# Patient Record
Sex: Female | Born: 1935 | Race: White | Hispanic: No | Smoking: Never smoker
Health system: Southern US, Community
[De-identification: ages and names within clinical notes are randomized; demographics above are authoritative.]

## PROBLEM LIST (undated history)

## (undated) DIAGNOSIS — F419 Anxiety disorder, unspecified: Secondary | ICD-10-CM

## (undated) DIAGNOSIS — Z8781 Personal history of (healed) traumatic fracture: Secondary | ICD-10-CM

## (undated) DIAGNOSIS — N182 Chronic kidney disease, stage 2 (mild): Secondary | ICD-10-CM

## (undated) DIAGNOSIS — Z8619 Personal history of other infectious and parasitic diseases: Secondary | ICD-10-CM

## (undated) HISTORY — DX: Anxiety disorder, unspecified: F41.9

## (undated) HISTORY — DX: Personal history of (healed) traumatic fracture: Z87.81

## (undated) HISTORY — PX: EYE SURGERY: SHX253

## (undated) HISTORY — DX: Personal history of other infectious and parasitic diseases: Z86.19

---

## 2008-03-04 ENCOUNTER — Encounter (HOSPITAL_BASED_OUTPATIENT_CLINIC_OR_DEPARTMENT_OTHER): Admission: RE | Admit: 2008-03-04 | Discharge: 2008-06-02 | Payer: Self-pay | Admitting: Internal Medicine

## 2008-06-03 ENCOUNTER — Encounter (HOSPITAL_BASED_OUTPATIENT_CLINIC_OR_DEPARTMENT_OTHER): Admission: RE | Admit: 2008-06-03 | Discharge: 2008-09-01 | Payer: Self-pay | Admitting: General Surgery

## 2008-09-03 ENCOUNTER — Encounter (HOSPITAL_BASED_OUTPATIENT_CLINIC_OR_DEPARTMENT_OTHER): Admission: RE | Admit: 2008-09-03 | Discharge: 2008-11-05 | Payer: Self-pay | Admitting: Internal Medicine

## 2009-02-28 HISTORY — PX: FRACTURE SURGERY: SHX138

## 2009-08-07 ENCOUNTER — Emergency Department (HOSPITAL_BASED_OUTPATIENT_CLINIC_OR_DEPARTMENT_OTHER): Admission: EM | Admit: 2009-08-07 | Discharge: 2009-08-07 | Payer: Self-pay | Admitting: Emergency Medicine

## 2009-08-07 ENCOUNTER — Ambulatory Visit: Payer: Self-pay | Admitting: Diagnostic Radiology

## 2009-08-08 ENCOUNTER — Emergency Department (HOSPITAL_BASED_OUTPATIENT_CLINIC_OR_DEPARTMENT_OTHER): Admission: EM | Admit: 2009-08-08 | Discharge: 2009-08-08 | Payer: Self-pay | Admitting: Emergency Medicine

## 2009-08-08 ENCOUNTER — Ambulatory Visit: Payer: Self-pay | Admitting: Diagnostic Radiology

## 2009-11-10 ENCOUNTER — Inpatient Hospital Stay (HOSPITAL_COMMUNITY): Admission: EM | Admit: 2009-11-10 | Discharge: 2009-11-18 | Payer: Self-pay | Admitting: Emergency Medicine

## 2009-11-10 ENCOUNTER — Ambulatory Visit: Payer: Self-pay | Admitting: Internal Medicine

## 2010-02-28 HISTORY — PX: FRACTURE SURGERY: SHX138

## 2010-03-04 ENCOUNTER — Encounter (HOSPITAL_BASED_OUTPATIENT_CLINIC_OR_DEPARTMENT_OTHER)
Admission: RE | Admit: 2010-03-04 | Discharge: 2010-03-30 | Payer: Self-pay | Source: Home / Self Care | Attending: Internal Medicine | Admitting: Internal Medicine

## 2010-03-11 ENCOUNTER — Ambulatory Visit
Admission: RE | Admit: 2010-03-11 | Discharge: 2010-03-11 | Payer: Self-pay | Source: Home / Self Care | Attending: Surgery | Admitting: Surgery

## 2010-03-31 ENCOUNTER — Encounter (HOSPITAL_BASED_OUTPATIENT_CLINIC_OR_DEPARTMENT_OTHER): Payer: Medicare Other | Attending: Internal Medicine

## 2010-03-31 DIAGNOSIS — L97309 Non-pressure chronic ulcer of unspecified ankle with unspecified severity: Secondary | ICD-10-CM | POA: Insufficient documentation

## 2010-03-31 DIAGNOSIS — Z96619 Presence of unspecified artificial shoulder joint: Secondary | ICD-10-CM | POA: Insufficient documentation

## 2010-04-01 ENCOUNTER — Encounter (HOSPITAL_BASED_OUTPATIENT_CLINIC_OR_DEPARTMENT_OTHER): Payer: Medicare Other

## 2010-05-04 ENCOUNTER — Encounter (HOSPITAL_BASED_OUTPATIENT_CLINIC_OR_DEPARTMENT_OTHER): Payer: Medicare Other | Attending: Internal Medicine

## 2010-05-04 DIAGNOSIS — L97309 Non-pressure chronic ulcer of unspecified ankle with unspecified severity: Secondary | ICD-10-CM | POA: Insufficient documentation

## 2010-05-04 DIAGNOSIS — Z96619 Presence of unspecified artificial shoulder joint: Secondary | ICD-10-CM | POA: Insufficient documentation

## 2010-05-13 LAB — CBC
HCT: 40.2 % (ref 36.0–46.0)
Hemoglobin: 15.9 g/dL — ABNORMAL HIGH (ref 12.0–15.0)
MCH: 29.9 pg (ref 26.0–34.0)
MCH: 30.9 pg (ref 26.0–34.0)
MCHC: 32.6 g/dL (ref 30.0–36.0)
MCHC: 32.7 g/dL (ref 30.0–36.0)
MCV: 91.8 fL (ref 78.0–100.0)
Platelets: 276 10*3/uL (ref 150–400)
Platelets: 378 10*3/uL (ref 150–400)
RDW: 13.3 % (ref 11.5–15.5)
RDW: 13.3 % (ref 11.5–15.5)
WBC: 12.6 10*3/uL — ABNORMAL HIGH (ref 4.0–10.5)

## 2010-05-13 LAB — DIFFERENTIAL
Basophils Absolute: 0 10*3/uL (ref 0.0–0.1)
Basophils Relative: 0 % (ref 0–1)
Eosinophils Absolute: 0 10*3/uL (ref 0.0–0.7)
Eosinophils Absolute: 0 10*3/uL (ref 0.0–0.7)
Eosinophils Relative: 0 % (ref 0–5)
Lymphocytes Relative: 10 % — ABNORMAL LOW (ref 12–46)
Lymphs Abs: 0.9 10*3/uL (ref 0.7–4.0)
Lymphs Abs: 1.1 10*3/uL (ref 0.7–4.0)
Monocytes Absolute: 0.4 10*3/uL (ref 0.1–1.0)
Monocytes Relative: 4 % (ref 3–12)
Monocytes Relative: 9 % (ref 3–12)
Neutro Abs: 11 10*3/uL — ABNORMAL HIGH (ref 1.7–7.7)
Neutrophils Relative %: 81 % — ABNORMAL HIGH (ref 43–77)
Neutrophils Relative %: 87 % — ABNORMAL HIGH (ref 43–77)

## 2010-05-13 LAB — PROTIME-INR
INR: 1.14 (ref 0.00–1.49)
INR: 1.38 (ref 0.00–1.49)
INR: 1.89 — ABNORMAL HIGH (ref 0.00–1.49)
INR: 0.99 (ref 0.00–1.49)
Prothrombin Time: 15.2 seconds (ref 11.6–15.2)
Prothrombin Time: 17.2 seconds — ABNORMAL HIGH (ref 11.6–15.2)
Prothrombin Time: 21.9 seconds — ABNORMAL HIGH (ref 11.6–15.2)

## 2010-05-13 LAB — POCT I-STAT, CHEM 8
Creatinine, Ser: 0.8 mg/dL (ref 0.4–1.2)
HCT: 51 % — ABNORMAL HIGH (ref 36.0–46.0)
Potassium: 3.4 mEq/L — ABNORMAL LOW (ref 3.5–5.1)
Sodium: 141 mEq/L (ref 135–145)

## 2010-05-13 LAB — COMPREHENSIVE METABOLIC PANEL
ALT: 13 U/L (ref 0–35)
AST: 20 U/L (ref 0–37)
Calcium: 8.1 mg/dL — ABNORMAL LOW (ref 8.4–10.5)
Creatinine, Ser: 0.85 mg/dL (ref 0.4–1.2)
GFR calc Af Amer: >60 mL/min (ref >60)
Sodium: 139 mEq/L (ref 135–145)
Total Protein: 5.4 g/dL — ABNORMAL LOW (ref 6.0–8.3)

## 2010-05-13 LAB — BASIC METABOLIC PANEL
CO2: 28 mEq/L (ref 19–32)
Chloride: 103 mEq/L (ref 96–112)
GFR calc Af Amer: >60 mL/min (ref >60)
Glucose, Bld: 118 mg/dL — ABNORMAL HIGH (ref 70–99)

## 2010-05-13 LAB — TYPE AND SCREEN
ABO/RH(D): B POS
Antibody Screen: NEGATIVE

## 2010-05-13 LAB — ABO/RH: ABO/RH(D): B POS

## 2010-05-13 LAB — LIPID PANEL
HDL: 62 mg/dL (ref >39)
LDL Cholesterol: 72 mg/dL (ref 0–99)
Total CHOL/HDL Ratio: 2.4 RATIO
VLDL: 14 mg/dL (ref 0–40)

## 2010-05-13 LAB — CARDIAC PANEL(CRET KIN+CKTOT+MB+TROPI)
CK, MB: 2.3 ng/mL (ref 0.3–4.0)
CK, MB: 3.4 ng/mL (ref 0.3–4.0)
Relative Index: INVALID (ref 0.0–2.5)
Total CK: 92 U/L (ref 7–177)
Troponin I: 0.02 ng/mL (ref 0.00–0.06)

## 2010-05-13 LAB — URINALYSIS, ROUTINE W REFLEX MICROSCOPIC
Bilirubin Urine: NEGATIVE
Urobilinogen, UA: 0.2 mg/dL (ref 0.0–1.0)
pH: 6.5 (ref 5.0–8.0)

## 2010-05-13 LAB — POCT CARDIAC MARKERS
CKMB, poc: <1 ng/mL — ABNORMAL LOW (ref 1.0–8.0)
Myoglobin, poc: 114 ng/mL (ref 12–200)

## 2010-05-13 LAB — HEMOGLOBIN A1C: Mean Plasma Glucose: 111 mg/dL (ref <117)

## 2010-05-13 LAB — TROPONIN I: Troponin I: 0.02 ng/mL (ref 0.00–0.06)

## 2010-05-13 LAB — MAGNESIUM: Magnesium: 1.7 mg/dL (ref 1.5–2.5)

## 2010-05-13 LAB — APTT: aPTT: 31 seconds (ref 24–37)

## 2010-05-13 LAB — MRSA PCR SCREENING: MRSA by PCR: NEGATIVE

## 2010-06-09 ENCOUNTER — Encounter (HOSPITAL_BASED_OUTPATIENT_CLINIC_OR_DEPARTMENT_OTHER): Payer: Medicare Other | Attending: Internal Medicine

## 2010-06-09 DIAGNOSIS — L97309 Non-pressure chronic ulcer of unspecified ankle with unspecified severity: Secondary | ICD-10-CM | POA: Insufficient documentation

## 2010-06-09 DIAGNOSIS — Z96619 Presence of unspecified artificial shoulder joint: Secondary | ICD-10-CM | POA: Insufficient documentation

## 2010-07-13 NOTE — Assessment & Plan Note (Signed)
[facility]   NAME:  Veronica [name], Veronica [name]              ACCOUNT NO.:  [id number]   MEDICAL RECORD NO.:  [id number]      DATE OF BIRTH:  03/15/1912   PHYSICIAN:  Jonelle Sports. Sevier, M.D.  VISIT DATE:  09/10/2008                                   OFFICE VISIT   HISTORY:  This 77 year old white female is followed for a stasis  ulceration on the distal pretibial area on the right.  Since she was  last seen 2 weeks ago, there has been some reduction in size of the  primary wound, but they are now 2 peripheral areas that appear to be  slightly macerated and with some suggestion there may be a low-grade  follicular infection there.   On examination, her blood pressure is 164/79, pulse 60, respirations 18,  temperature 98.1.  The ulcer size is 1.3 x 1.9 x 0.1 cm.  There is no  active drainage, but again there is some hint of some maceration that  may have occurred in satellite areas, not contiguous with the wound, but  one laterally and another medially.   IMPRESSION:  Satisfactory progress impeded by the slow healing of  advanced age and peripheral arterial disease.   DISPOSITION:  The areas of follicular concern are appropriately cultured  today.   The wound itself will be treated with an application of Prisma and then  covered with an Adaptic pad as will the 2 areas of other concern.  That  extremity will then be returned to an Unna wrap.  This wrap is changed  twice weekly by the Home Health nurses.   Because of the concerns and because the culture being made today, the  patient will be seen again in 1 week.           ______________________________  Jonelle Sports. Cheryll Cockayne, M.D.     RES/MEDQ  D:  09/10/2008  T:  09/10/2008  Job:  [id number]

## 2010-07-13 NOTE — Assessment & Plan Note (Signed)
[facility]   NAME:  JERIAH, SKUFCA              ACCOUNT NO.:  [id number]   MEDICAL RECORD NO.:  [id number]      DATE OF BIRTH:  1912-11-25   PHYSICIAN:  Joanne Gavel, M.D.         VISIT DATE:  09/17/2008                                   OFFICE VISIT   HISTORY OF PRESENT ILLNESS:  A 77 year old female with stasis  ulcerations, distal pretibial area.  The patient has developed some  wounds on the lateral aspect of the leg.   PHYSICAL EXAMINATION:  Temperature 98, pulse 60, respirations 18, blood  pressure 150/72.  The right lower extremity has several tiny new  ulcerations laterally.  The ulcerations medially are somewhat smaller.   PLAN:  The wounds are washed.  There is no need for debridement.  Dress  the wound with gentamicin ointment and Unna boots, and see in 7 days.      Joanne Gavel, M.D.  Electronically Signed     RA/MEDQ  D:  09/17/2008  T:  09/17/2008  Job:  [id number]

## 2010-07-13 NOTE — Assessment & Plan Note (Signed)
[facility]   NAME:  Veronica [name], Veronica [name]              ACCOUNT NO.:  [id number]   MEDICAL RECORD NO.:  [id number]      DATE OF BIRTH:  June 11, 1912   PHYSICIAN:  Barry Dienes. Eloise Harman, M.D. VISIT DATE:  05/20/2008                                   OFFICE VISIT   SUBJECTIVE:  The patient is a 77 year old woman who is followed at our  clinic for right leg venous stasis ulcers.  She occasionally has some  discomfort in the ulcers and last evening had a difficult time sleeping  because of right leg pain.  She has been somewhat reluctant to take  Percocet, fearing that she would be addicted to the medication.  She has  not had fever or chills.   OBJECTIVE:  VITAL SIGNS:  Blood pressure 143/73, pulse 60, respirations  18, and temperature 98.1.   The condition of the wounds is as follows:  Wound #1:  It is located in the lateral aspect of the right leg and  measures 6.0 cm x 2.5 cm x 0.2 cm.  This wound does not have significant  eschar, but has minimal necrotic debris with a yellow and pale pink  wound base and pink granulation tissue.  There is no exposed bone,  tendon, or muscle and no foul odor and a small amount of serosanguineous  discharge with mild erythema around the wound.  Wound #2:  It is located on the posterior right leg and measures 1.5 cm  x 0.8 cm x 0.1 cm.  This wound does not have significant eschar and has  minimal necrotic debris with pale pink wound base and pink granulation  tissue.  There was no exposed bone, tendon, muscle, and no foul odor.  There is a small amount of serosanguineous discharge with an intact  periwound integrity.   ASSESSMENT:  There was slight improvement in the appearance of stasis  ulcers on the right leg after treatment with Oasis last week.   PLAN:  After 5% lidocaine gel was applied to the ulcers, a #15 scalpel  was used to debride the necrotic material from the ulcers.  This was  accomplished without significant pain or  bleeding.  She was also  encouraged to treat her pain as necessary.  She was given a prescription  for tramadol 50 mg p.o. t.i.d. p.r.n. mild pain and Percocet 5/325 up to  t.i.d. p.r.n. moderate pain, #90 on Percocet and tramadol was #90 refill  too.  She was assured that at 65, she was unlikely to become addicted to  medications.  Her wounds, which again treated with application of OASIS  covered by Mepitel, Steri-Strips, SofSorb, Kerlix, and an Ace wrap.  These wounds will continue to be changed once at our clinic weekly and  once by home health nursing.  Home health nurses were again advised not  to remove the Mepitel dressing.           ______________________________  Barry Dienes. Eloise Harman, M.D.     DGP/MEDQ  D:  05/20/2008  T:  05/21/2008  Job:  [id number]

## 2010-07-13 NOTE — Assessment & Plan Note (Signed)
[facility]   NAME:  Veronica [name], Veronica [name]              ACCOUNT NO.:  [id number]   MEDICAL RECORD NO.:  [id number]      DATE OF BIRTH:  28-Jul-1912   PHYSICIAN:  Leonie Man, M.D.    VISIT DATE:  05/27/2008                                   OFFICE VISIT   PROBLEM:  Mixed venous stasis and peripheral arterial disease with  venous stasis ulcers x2 located on the right lateral and right posterior  legs.  The patient's ABI on the initial evaluation was falsely elevated  to greater than 1.2.   This is a 77 year old lady who is now here today with her son, has been  undergoing current therapy with Oasis, Mepitel, Kerlix, and Ace on both  of her leg ulcers.   PHYSICAL EXAMINATION:  GENERAL:  The patient is alert, oriented, and  cooperative.  VITAL SIGNS:  She is afebrile.  Temperature 98.4, pulse 74, respirations  18, and blood pressure 128/68.  EXTREMITIES:  Examination of the leg shows there is some mild cyanosis  an elevation of her right lower extremity particularly the tips of the  toes and the feet.  There are no palpable pulses.  Superior to this is  an area of venous stasis ulceration which is the right lateral leg,  which shows a minimal fibrinous exudates on the inferior portion of the  wound with good granulation superiorly and some starting epithelization.  There is no odor and there is minimal drainage and there is no erythema.  The right posterior ulcer shows a necrotic base.  The wound edges are  not elevated and there is no odor and there is minimal drainage.   Treatment today, selective debridement less than 20 sq cm of right  lateral and right posterior legs, and right posterior ulcers, and the  right lateral ulcer Oasis graft covered with Mepitel and Estrasorb is  placed.  The right posterior wound.  I have instilled Santyl with a damp-  to-dry gauze.   DISPOSITION:  The patient will be visited by the visiting nurse service,  will follow up for  dressing changes during the coming week and she will  come back to see [place] in 1 week.      Leonie Man, M.D.  Electronically Signed     PB/MEDQ  D:  05/27/2008  T:  05/28/2008  Job:  [id number]

## 2010-07-13 NOTE — Assessment & Plan Note (Signed)
[facility]   NAME:  Veronica [name], Veronica [name]              ACCOUNT NO.:  [id number]   MEDICAL RECORD NO.:  [id number]      DATE OF BIRTH:  August 23, 1912   PHYSICIAN:  Jonelle Sports. Sevier, M.D.  VISIT DATE:  09/24/2008                                   OFFICE VISIT   HISTORY:  This 77 year old white female is followed for stasis  ulcerations of the right lower extremity.  These have been slow to heal  in the face of her advanced age and some peripheral arterial disease.  Recent culture had shown MRSA complicating the situation with several  little pustules, follicular in nature, proximal to the primary wound.  She had been treated with 10 days' worth of doxycycline 100 mg per day  and the wounds themselves treated with an application of gentamicin  ointment and an Radio broadcast assistant.   She returns today with not much changing with some semi-moist crusty  accumulations on the wounds themselves.  She reports no particular  systemic symptoms in her tolerance for the doxycycline apparently has  been good.   PHYSICAL EXAMINATION:  Blood pressure is 146/76, pulse 62 and regular,  respirations 18, and temperature 98.2.  The areas are difficult to  measure and the nurses re-measured them since my debridement and those  are recorded in the chart, not before me at the moment.   IMPRESSION:  Fairly static course with chronic venous stasis ulcerations  with secondary methicillin-resistant Staphylococcus aureus infection.   DISPOSITION:  Medial and lateral wounds were both today debrided of this  semi-soft crust using a sharp paper edge, which is well tolerated and  accomplished without any significant bleeding.  The wound bases are then  fairly clear.  There remains some general erythema in the area.   Following this debridement, they are washed again extensively and then  the extremity is treated with a wide application of Neosporin.  The  wounds themselves covered with nonstick dressings,  this held in place  with a bulky wrap.   Instructions are sent with the patient so that her son may cleanse the  wounds daily with tepid water and Dial soap, but rinse them well, pat  them dry, and replace a dressing as above except to use Polysporin  instead of Neosporin.   Her doxycycline is reduced from 100 mg b.i.d. to 100 mg daily and she is  given a prescription for 20 more of these.   Followup visit will be here in 2 weeks.           ______________________________  Jonelle Sports Cheryll Cockayne, M.D.     RES/MEDQ  D:  09/24/2008  T:  09/24/2008  Job:  [id number]

## 2010-07-13 NOTE — Assessment & Plan Note (Signed)
[facility]   NAME:  Veronica [name], Veronica [name] NO.:  [id number]   MEDICAL RECORD NO.:  [id number]      DATE OF BIRTH:  Jul 27, 1912   PHYSICIAN:  Maxwell Caul, M.D.      VISIT DATE:                                   OFFICE VISIT   LOCATION:  [facility].   Mrs. Dorfman is a woman we are following for a wound on the right leg.  The exact etiology of this is really uncertain.  It started with a  stinging sensation perhaps an insect bite.  It gradually opened and  extended.  The etiology of this was never really certain to me.  There  may be trauma perhaps some degree of venous insufficiency.  We did do  ABIs last time and she had an ABI of 0.85, but reasonably normal looking  wave forms.   This required a difficult debridement the last time she was here.  We  prescribed Santyl, a moist gauze under a Profore light wrap.  I put her  on doxycycline because of periwound erythema.  Culture of this wound  grew methicillin-sensitive resistant staph aureus which is sensitive to  the doxycycline.   On examination, her temperature was 97.7.  The degree of periwound  erythema here was much better.  The wound measured 3.1 x 2.1 x 0.3.  All  of this slightly improved.  The wound underwent a gentle debridement and  it removed some surface slough and biofilm that really left the wound  looking much better than last time.   IMPRESSION:  Right lateral leg wound of uncertain etiology.  This is  debrided as noted above.  The base of this looks healthy.  I will  continue the Santyl for another week and then probably switch her to a  silver-based dressing at that point.  I am hopeful that the wound will  not require debridement next time.  Her doxycycline was continued for 1  week to make a total of slightly over 2 weeks.  I did re-prescribe her  Percocet for pain.           ______________________________  Maxwell Caul, M.D.     MGR/MEDQ   D:  03/20/2008  T:  03/21/2008  Job:  [id number]

## 2010-07-13 NOTE — Assessment & Plan Note (Signed)
[facility]   NAME:  [name], [name]              ACCOUNT NO.:  [id number]   MEDICAL RECORD NO.:  [id number]      DATE OF BIRTH:  1912/10/30   PHYSICIAN:  Maxwell Caul, M.D. VISIT DATE:  03/27/2008                                   OFFICE VISIT   Ms. Veronica [name] is a lady we have been following for wound on her right leg.  The exact etiology of this was never really determined.  She stated it  started with a stinging sensation that gradually opened and extended.  There was no history of trauma here.  She does have some venous  insufficiency.  We did do ABIs last time and there is some evidence of  mild peripheral vascular disease but by itself I do not think this was  the cause.  She has required a difficult series of debridements to clean  up this wound.  She continues with Santyl and moist gauze under a  Profore Lite wrap.  Initially, when she came here, there was cellulitis  which again may have been something to do with the how this wound  started in the first place.  Culture of this wound grew methicillin  staph or resistant staph aureus.   The patient is not complaining of pain or systemic symptoms.  She is  having some redness and erythema on the lateral aspect of her other foot  (left) and I have been asked to look at this.   On examination, she is afebrile with a temperature of 97.5.  She looks  systemically well.  The area on the right lateral lower extremity  measures 4.3 x 2.6 x 0.2.  There are fluctuations in the dimensions,  however, in general, I think the wound looks healthier.  There is less  eschar and the debridements are better tolerated and required to be less  aggressive.  The cellulitis had surrounded this wound when I initially  thought is resolved.   IMPRESSION:  1. Right lateral leg wound of uncertain etiology possibly cellulitis      in the setting of venous insufficiency.  The base of this wound was      once again debrided.  We  will continue with a Santyl and moist      gauze.  I am hopeful at some point to change her to another      dressing base.  She does not require any further antibiotics at      this point.   1. Possible pressure areas on the lateral aspect of the left foot.      This included the lateral aspect of her left fifth metatarsal head,      the mid left foot in the lateral aspect of her calcaneus and the      left lateral malleolus.  This really looked like a pressure      phenomenon to me.  She does not spend much time in footwear.  I did      give her a prescription for a Bunny Boot to wear at night less this      be a pressure area.  There was no evidence of infection.  The      peripheral pulses seemed quite intact.  ______________________________  Maxwell Caul, M.D.     MGR/MEDQ  D:  03/27/2008  T:  03/28/2008  Job:  [id number]

## 2010-07-13 NOTE — Assessment & Plan Note (Signed)
[facility]   NAME:  Veronica [name], Veronica [name]              ACCOUNT NO.:  [id number]   MEDICAL RECORD NO.:  [id number]      DATE OF BIRTH:  1912-09-07   PHYSICIAN:  Ardath Sax, M.D.           VISIT DATE:                                   OFFICE VISIT   She comes for followup appointment.  She had ulcers on her right lower  extremity, and they have been treated with neosporin.  The etiology of  these is not certain.  She is not a diabetic.  She did have an injury to  her right shoulder and apparently perhaps she got a pressure sore after  that, but at any rate it has been almost completely healed now.  There  is a 1 mm area left that is healing in and epithelizing nicely.  She is  going to continue using a neosporin, and I think she can be discharged  from the [facility].      Ardath Sax, M.D.     PP/MEDQ  D:  10/08/2008  T:  10/08/2008  Job:  [id number]

## 2010-07-13 NOTE — Assessment & Plan Note (Signed)
[facility]   NAME:  Veronica [name], Veronica [name]              ACCOUNT NO.:  [id number]   MEDICAL RECORD NO.:  [id number]      DATE OF BIRTH:  Jun 27, 1912   PHYSICIAN:  Barry Dienes. Eloise Harman, M.D.     VISIT DATE:                                   OFFICE VISIT   SUBJECTIVE:  The patient is a 77 year old woman who has been followed in  our clinic for right leg venous stasis ulcers.  She has not had  significant pain in her right leg and has not had fever or chills.  At  last visit, she was treated with Prisma, absorptive pads, Kerlix, and  Coban, changed 3 times weekly.   OBJECTIVE:  VITAL SIGNS:  Blood pressure 171/78, pulse 72, respirations  19, temperature 98.2.   The condition of the wounds is as follows:  Wound #1 on the lateral right leg measures 4.0 cm x 1.2 cm x 0.2 cm.  This wound does not have significant eschar but has minimal necrotic  debris with a yellow and pale pink wound base and pink granulation  tissue.  There was no exposed bone, tendon, or muscle and no foul odor.  There was a small amount of serosanguineous drainage with an intact  periwound integrity.  Wound #2:  It is located on the posterior aspect of the right leg and  measures 1.7 cm x 1.4 cm x 0.2 cm.  This wound does not have significant  eschar but has minimal necrotic debris with a pale pink wound base and  pink granulation tissue.  There is no exposed bone, tendon, or muscle  and no foul odor.  There was a small amount of serosanguineous drainage  and an intact periwound integrity.  Wound #3 is new and located on the anterior aspect of the right leg,  measuring 1.5 cm x 3.0 cm x 0.2 cm.  This wound has partial coverage  with eschar and minimal necrotic debris with a pale pink wound base and  pink granulation tissue.  There was no exposed bone, tendon, or muscle  and no foul odor.  There was scant amount of serosanguineous discharge  and intact periwound integrity.   ASSESSMENT:  Interval  improved appearance of multiple right leg venous  stasis ulcer with a healthy appearing granulation tissue following  treatment with Prisma.  Of note, she also has had consistently elevated  systolic blood pressures.   PLAN:  After 5% lidocaine ointment was applied to the ulcers, a #15  scalpel blade was used to gently debride the necrotic debris on the  ulcers and the necrotic tissue surrounding the ulcers.  This was  accomplished by selective debridement of less than 20 sq cm without  significant bleeding or discomfort.  Following this, all of right leg  wounds were treated with Prisma followed by an absorptive pad, Kerlix,  and a Coban wrap, which will be repeated 3 times weekly.     She should have a physician reevaluation in approximately 1 week at  our clinic.  Her dressings are changed at home by home health nurses.           ______________________________  Barry Dienes Eloise Harman, M.D.    DGP/MEDQ  D:  07/01/2008  T:  07/02/2008  Job:  [id number] 

## 2010-07-13 NOTE — Assessment & Plan Note (Signed)
[facility]   NAME:  Veronica [name], Veronica [name]              ACCOUNT NO.:  [id number]   MEDICAL RECORD NO.:  [id number]      DATE OF BIRTH:  03-Jul-1912   PHYSICIAN:  Maxwell Caul, M.D. VISIT DATE:  03/10/2008                                   OFFICE VISIT   Veronica [name] is a 77 year old woman from the [facility] who is here  for our review of a wound on the right leg.  We do not have a  physician's review of this.  The history is entirely obtained from the  patient and her son, who accompanied her.   Apparently 2 months ago, this lady developed a small lesion on the  lateral aspect of her right leg, which was associated with a stinging  sensation.  This opened and gradually has expanded.  She has some  discomfort associated with this, but has not had a systemically ill  feeling.  She also describes some edema associated with this wound  before it happened.  This was also brought to the attention of the  doctors who were following her for recent right hip fracture, although  it apparently did not raise any particular alarms.  In any case, the  wound became covered by an eschar.  They have been cleaning this with  [place] soap and putting topical neomycin on this.   PAST MEDICAL HISTORY:  1. Status post fall in May with shoulder, wrist, and elbow injury.  2. Right shoulder replacement.  3. Right hip fracture.  4. Appendectomy as a teenager.  5. Cataract surgery.   MEDICATION LIST:  Reviewed.   She has no known drug allergies.   REVIEW OF SYSTEMS:  GENERAL:  She does not complain of a fever.  SKIN:  She has not had any rashes.  VASCULAR:  She does not describe  claudication pain.   PHYSICAL EXAMINATION:  GENERAL:  She does not look to be in any  distress.  VITAL SIGNS:  Temperature is 97.8, pulse 64, respirations 18, blood  pressure is 188/83.  RESPIRATORY:  Clear air entry bilaterally.  CARDIAC:  Heart sounds were normal.  There were no murmurs.  No  increase  in jugular venous pressure and no S3 and no signs of congestive heart  failure.  EXTREMITIES:  There are peripheral pulses at the dorsalis pedis and  posterior tibial were definitely reduced but palpable.  Capillary refill  time was borderline.   WOUND EXAM:  The wound was on the lateral aspect of her right leg.  This  was initially covered with a black eschar, which was separating.  This  underwent a nonexcisional debridement removing the eschar and some  underlying necrotic material.  We took this down to a reasonably clean  base.  I was also concerned about some degree of surrounding erythema  around this wound, although pain did not seem to be a big part of the  issue here.   IMPRESSIONS:  Right lateral leg wound:  The exact etiology here is  uncertain.  There did not appear to be trauma.  We did go ahead and do  ankle-brachial indices, and she had an ankle-brachial index of 0.85 but  had reasonably normal-looking waveforms, which did not suggest  significant ischemia.  Historically, she did have a significant edema  surrounding when this wound formed, so it is perhaps that had something  to do with the pathogenesis, although right now, her degree of edema  seems minimal.   The wound underwent the debridement noted above.  The base of this is  still not clean.  We did prescribe Santyl for her and a moist gauze, dry  dressing under a Profore Lite wrap.  Because of the degree of  surrounding erythema, I put her on doxycycline.  The base of this wound  after debriding was empirically cultured.  We will see her again in a  week.  She is on doxycycline.  I prescribed Percocet for pain.           ______________________________  Maxwell Caul, M.D.     MGR/MEDQ  D:  03/10/2008  T:  03/11/2008  Job:  [id number]

## 2010-07-13 NOTE — Assessment & Plan Note (Signed)
[facility]   NAME:  [name], [name]              ACCOUNT NO.:  [id number]   MEDICAL RECORD NO.:  [id number]      DATE OF BIRTH:  [birth year]   PHYSICIAN:  Leonie Man, M.D.    VISIT DATE:  04/22/2008                                   OFFICE VISIT   Ms. Veronica [name] is a 77 year old lady with a right lateral leg wound, its  etiology is obscure, this may be from simply scratching her leg or there  could also be some element of venous stasis involved.  The leg is not  swollen.  She did grow MRSA out of this wound and has been on  doxycycline for the past 10 days.  She returns today for an evaluation.  She is having some pain in her leg but not significantly so.  There is  no odor.  There is no drainage.   On examination, I took down her Profore Lite dressing.  There is a wound  area, which measures a total of 6.5 x 11 cm in dimensions.  This is a  rather shallow wound, measuring 0.1 cm in depth.  Following washing and  mechanical debridement with a scrub brush, I used a curette to debride  some of the fibrin and exudate at the base of the wound.  There is some  additional fibrin and exudate left in the wound.  I am going to go ahead  and treat this with ACE, Santyl, and damp to dry dressing.  I will wrap  her with a bulky dressing and have the visiting nurse service change  this 3 times weekly.  I will see her again in 1 week.      Leonie Man, M.D.  Electronically Signed     PB/MEDQ  D:  04/22/2008  T:  04/22/2008  Job:  [id number]

## 2010-07-13 NOTE — Assessment & Plan Note (Signed)
[facility]   NAME:  Veronica [name], Veronica [name]              ACCOUNT NO.:  [id number]   MEDICAL RECORD NO.:  [id number]      DATE OF BIRTH:  1912-09-10   PHYSICIAN:  Barry Dienes. Eloise Harman, M.D.     VISIT DATE:                                   OFFICE VISIT   SUBJECTIVE:  The patient is a 77 year old Caucasian woman who is seen  for reevaluation of right leg venous stasis ulceration.  Currently, she  has been on treatment with Santyl, covered by moist gauze, covered by  absorptive pad and an Ace wrap to the right leg wounds that is changed 3  times weekly.  She has not had significant pain in her legs or fever or  chills.  She has been eating well.   OBJECTIVE:  VITAL SIGNS:  Blood pressure 157/78, pulse 59, respirations  20, and temperature 98.   The condition of the wounds is as follows:  Wound #1:  It is located on the right lateral leg and measures 6.5 cm x  2.5 cm x 0.2 cm.  This wound does not have significant eschar and has  minimal necrotic debris with a pale pink wound base and pink granulation  tissue.  There is no exposed bone, tendon, or muscle and no foul odor.  There was a scant amount of serosanguineous drainage and an intact  periwound integrity.  Wound #2:  It is located on the posterior right leg and measures less  than 0.2 cm x less than 0.2 cm x 0.1 cm.  This wound does not have  significant eschar or necrotic tissue and has a pale pink wound base and  pink granulation tissue.  There is no exposed bone, tendon, or muscle  and no foul odor and no significant discharge and the periwound area was  normal.   ASSESSMENT:  Stable appearance of right leg venous stasis ulcer.  This  wound would like to do better with venous compression wrap and  stimulation of the wound bed.   PLAN:  A #15 scalpel was used to debride the small amount of necrotic  tissue from the wounds.  There was less than 20 cm sq.  This was  accomplished after 5% lidocaine ointment was  applied and did not cause  significant pain or bleeding.  Following this, the large ulcer was  covered with Oasis, covered by Mepitel, covered by Steri-Strips,  SofSorb, Kerlix gauze, and then an Ace wrap.  The dressings outside of  the Mepitel will be changed 2 times weekly, once at our clinic and once  by Home Health nurses every Friday.  She will be scheduled for a  physician reevaluation visit in approximately 1 week.           ______________________________  Barry Dienes. Eloise Harman, M.D.     DGP/MEDQ  D:  05/13/2008  T:  05/14/2008  Job:  [id number]

## 2010-07-13 NOTE — Assessment & Plan Note (Signed)
[facility]   NAME:  Veronica [name], Veronica [name]              ACCOUNT NO.:  [id number]   MEDICAL RECORD NO.:  [id number]      DATE OF BIRTH:  [birth year]   PHYSICIAN:  Joanne Gavel, M.D.         VISIT DATE:  08/13/2008                                   OFFICE VISIT   HISTORY:  A 77 year old female with a wound on the pretibial area and  area of venous stasis disease.   Physical examination reveals temperature 98, pulse 60, respirations 20,  blood pressure 155/70.  The wound is 2.1 x 2.6 x 0.1.  There is a tiny  wound right next to it.  There is slough in the base which is debrided  with curette after application of local anesthetic ointment.   I think the wound would benefit from collagen.  We will dress with  Puracol, hydrogel, Adaptic, and an Unna boot and see her in 7 days.      Joanne Gavel, M.D.  Electronically Signed     RA/MEDQ  D:  08/13/2008  T:  08/13/2008  Job:  [id number]

## 2010-07-13 NOTE — Assessment & Plan Note (Signed)
[facility]   NAME:  Veronica [name], EMAMI              ACCOUNT NO.:  [id number]   MEDICAL RECORD NO.:  [id number]      DATE OF BIRTH:  10-13-1912   PHYSICIAN:  Jonelle Sports. Sevier, M.D.  VISIT DATE:  06/11/2008                                   OFFICE VISIT   HISTORY:  This 77 year old white female who is being followed for two  venous stasis ulcers on her distal left lower extremity; one  anterolaterally and one posteriorly.  These are proximal to the ankle.  Last week, we had put Oasis on the larger more anterior of these two,  but that may have been a bit premature.  She has had less pain than  before and no other particular complaints and specifically, no fever or  systemic symptoms.   Blood pressure 172/78, pulse 71, respirations 18, temperature 98.4.  The  anterior lesion now measures 6.0 x 2.9 x 0.1 cm and is covered with  considerable soft semi-adherent slough.   The posterior wound is 2.2 x 1.8 x 0.1 and likewise has some soft semi-  adherent slough.   IMPRESSION:  Satisfactory course, but not yet ready for Oasis or other  advance therapy.   DISPOSITION:  Both wounds are sharply debrided without difficulty to  remove the slough indicated.  She is able to tolerate this without  anesthesia.  The wounds are then dressed with applications of silver  alginate covered by an absorptive pad and that extremity was placed in a  Kerlix, Coban wrap.   Followup visit will be here in 1 week.           ______________________________  Jonelle Sports. Cheryll Cockayne, M.D.     RES/MEDQ  D:  06/11/2008  T:  06/12/2008  Job:  [id number]

## 2010-07-13 NOTE — Assessment & Plan Note (Signed)
[facility]   NAME:  Veronica [name], Veronica [name]              ACCOUNT NO.:  [id number]   MEDICAL RECORD NO.:  [id number]      DATE OF BIRTH:  07/27/1912   PHYSICIAN:  Joanne Gavel, M.D.              VISIT DATE:                                   OFFICE VISIT   HISTORY OF PRESENT ILLNESS:  A 77 year old woman with right leg venous  stasis ulcers, these have been present since January.  There is no sign  of infection.   PHYSICAL EXAMINATION:  VITAL SIGNS:  Temperature 98.3, respirations 16,  and blood pressure 136/80.  GENERAL APPEARANCE:  She is awake and alert.  Examination of the  ulcerations revealed 3 separate ulcerations on the right anterior  foreleg.  There is very little swelling.  There is a great deal of  sloughing in the the wounds.  This is debrided using curette with  topical anesthesia only.  Bleeding is minimal, treated with pressure.   TREATMENT:  Promogram, Hydrogel, and Profore.  See in 7 days for change  of Profore.      Joanne Gavel, M.D.  Electronically Signed     RA/MEDQ  D:  07/16/2008  T:  07/16/2008  Job:  [id number]

## 2010-07-13 NOTE — Assessment & Plan Note (Signed)
[facility]   NAME:  Veronica [name], Veronica [name]              ACCOUNT NO.:  [id number]   MEDICAL RECORD NO.:  [id number]      DATE OF BIRTH:  29-Apr-1912   PHYSICIAN:  Jonelle Sports. Sevier, M.D.  VISIT DATE:  06/18/2008                                   OFFICE VISIT   HISTORY:  This 77 year old white female is followed for 3 stasis  ulcerations around the lateral and posterior aspect of the right ankle.   Several weeks ago, had tried Oasis on 1 of these, but it seemed to have  been a little premature.  Since her visit here a week ago, she has been  treated simply with silver alginate and Kerlix and Coban wrap, which has  been changed since her Wednesday visit, it was changed on Friday and  Monday at home by the home health nurse.   The patient reports virtually no pain and no other symptoms of concern.   PHYSICAL EXAMINATION:  Blood pressure 182/85, pulse 94, respirations 20,  and temperature 98.1.  The 2 wounds are now felt worthy of  measurement  and those are posteriorly 6.0 x 2.0 x 0.1 cm and another more laterally  2.8 x 0.6 x 0.1.  Both have granular bases with minimal slough.   IMPRESSION:  Satisfactory course.   DISPOSITION:  The wounds are gently selectively debrided to remove some  crusts at the wound margins as well as small amounts of slough in the  wound bases.  There is definite evidence of epithelial progression,  particularly on the larger of these 2 wounds.   The wounds will be dressed today with applications of Prisma covered by  absorptive pads and extremity then returned to a Kerlix and Coban wrap.  This will be changed on Friday and Monday at home and the patient will  be seen again in 1 week hence, which is Wednesday.            ______________________________  Jonelle Sports. Cheryll Cockayne, M.D.     RES/MEDQ  D:  06/18/2008  T:  06/18/2008  Job:  [id number]

## 2010-07-13 NOTE — Assessment & Plan Note (Signed)
[facility]   NAME:  Veronica [name], Veronica [name]              ACCOUNT NO.:  [id number]   MEDICAL RECORD NO.:  [id number]      DATE OF BIRTH:  May 03, 1912   PHYSICIAN:  Jonelle Sports. Sevier, M.D.  VISIT DATE:  07/23/2008                                   OFFICE VISIT   HISTORY:  This 77 year old white female with significant peripheral  arterial disease has been followed for venous stasis ulcerations of the  distal right lower extremity.  These have made limited progress with  conventional treatments, but of course, we have not been able to use  compressive therapies for obvious reasons.   She appears today with some general reddening in the area and with new  crusts and slough over several ulcerated areas.   PHYSICAL EXAMINATION:  Blood pressure 147/81, pulse 73, respirations 14,  temperature 97.9.   She now has as many as five wounds on the right lower extremity, the  dimensions of which are outlined in her chart.  These uniformly have  some crust and loose skin on them and the largest one, which is 2.9 x  3.5 cm has considerable slough and a small amount of necrosis.   IMPRESSION:  Worsening of wounds of right lower extremity, perhaps  related to low-grade infection.   DISPOSITION:  The wounds are sharply debrided of the crusts, loose skin,  and slough and the largest wound is more aggressively debrided.   Following such debridement, culture is made from the principle wound and  one of the satellite wounds.   The patient was placed on cephalexin 500 mg t.i.d. given a total of #30  until culture results are known and a change can be made if indicated.   The extremity will be treated with an application of Santyl to that big  wound, Neosporin to the lesser wounds, and this covered with a  absorptive pad and then an Unna paced layer and finally Kerlix Coban.  She is appropriately instructed as to the symptoms should her dressing  prove too tight.   She is given a  renewal on oxycodone/APAP.   Followup visit will be here in 2 weeks with home health nurse changing  her dressings twice a week in the interim.           ______________________________  Jonelle Sports Cheryll Cockayne, M.D.     RES/MEDQ  D:  07/23/2008  T:  07/23/2008  Job:  [id number]

## 2010-07-13 NOTE — Assessment & Plan Note (Signed)
[facility]   NAME:  Veronica [name], Veronica [name]              ACCOUNT NO.:  [id number]   MEDICAL RECORD NO.:  [id number]      DATE OF BIRTH:  01-10-1913   PHYSICIAN:  Joanne Gavel, M.D.         VISIT DATE:  08/20/2008                                   OFFICE VISIT   HISTORY OF PRESENT ILLNESS:  A 77 year old female with a pretibial wound  in the area of venous stasis.   PHYSICAL EXAMINATION:  She is afebrile and stable.  The wound is  slightly smaller at 2 x 2.4.  The tiny wound next to that is healed.  There is no slough in the base.  It is quite clean appearing.   PLAN:  Continue Puracol, hydrogel, Adaptic, and Unna boot.  See in 7  days.      Joanne Gavel, M.D.  Electronically Signed     RA/MEDQ  D:  08/20/2008  T:  08/20/2008  Job:  [id number]
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
## 2010-07-13 NOTE — Assessment & Plan Note (Signed)
[facility]   NAME:  Veronica [name], Veronica [name]              ACCOUNT NO.:  [id number]   MEDICAL RECORD NO.:  [id number]      DATE OF BIRTH:  11-17-1912   PHYSICIAN:  Barry Dienes. Eloise Harman, M.D.     VISIT DATE:                                   OFFICE VISIT   SUBJECTIVE:  The patient is a 77 year old Caucasian woman who is seen  for reevaluation of a right leg ulcer.  It is unclear the etiology of  the wound, possibly from mild trauma versus venous stasis ulcer.  She  has had the wound treated with Santyl and moist gauze with an absorptive  pad and Profore Lite, changed 3 times weekly.  At night, sometimes her  right leg aches a significant amount.  She otherwise denies claudication  symptoms.  She has not had fever or chills.   OBJECTIVE:  VITAL SIGNS:  Blood pressure 167/75, pulse 80, respirations  18, temperature 97.8.   Today, she had an ankle brachial index done on the right side with  brachial systolic blood pressure 181 and right ankle systolic pressure  of 168 with ABI of 0.94.  The dorsalis pedis artery was quite dampened  and monophasic, and the posterior tibial artery tracing was monophasic.   The condition of the ulcers is as follows:  Wound #1:  It is located on the right lateral leg and measures 6 cm x 3  cm x 0.2 cm.  This wound does not have significant eschar but has  moderate necrotic tissue with a yellow and pale pink wound base and pink  granulation tissue.  There was no exposed bone, tendon, or muscle, no  foul odor, and a small amount of yellow drainage with periwound  erythema.  Wound #2:  It is located on the posterior right leg and measures 0.5 cm  x 0.3 cm x 0.2 cm.  This wound does not have significant eschar but has  minimal necrotic debris with a pale pink wound base and pink granulation  tissue.  There was no exposed bone, tendon, or muscle.  There is no foul  odor and a scant amount of serous drainage with periwound erythema.  The  right  distal leg and foot were somewhat cool.   ASSESSMENT:  Interval slight improvement in ulcers on the right leg.   PLAN:  After the wounds were cleaned and treated with topical 5%  lidocaine ointment, a #15 scalpel was used to debride the necrotic  debris from the ulcers.  The amount of area debrided was less than 20  cm2.  Following this, the ulcers were treated with Santyl, covered by a  moist gauze, then an absorptive pad, then an Ace wrap.  We will continue  to have the dressings changed 3 times weekly by home health nurses.  Given her elevated blood pressure in the clinic today, it was advised  that she purchase [employer] digital blood pressure meter and check blood  pressures twice daily for a week, then relate the results to her primary  physician.  If her wound does not show significant improvement, we will  consider further study of her arterial blood flow with transcutaneous  oxygen measurement as she may have a falsely elevated ABI with calcified  vessels  and diminished perfusion.  She will be scheduled to have a  physician reevaluation in approximately 1 week.          ______________________________  Barry Dienes. Eloise Harman, M.D.    DGP/MEDQ  D:  05/06/2008  T:  05/07/2008  Job:  [id number]

## 2010-07-13 NOTE — Assessment & Plan Note (Signed)
[facility]   NAME:  Veronica [name], Veronica [name]              ACCOUNT NO.:  [id number]   MEDICAL RECORD NO.:  [id number]      DATE OF BIRTH:  April 08, 1912   PHYSICIAN:  Joanne Gavel, M.D.         VISIT DATE:  07/09/2008                                   OFFICE VISIT   CHIEF COMPLAINT:  Multiple ulcerations of right lower extremity.   PHYSICAL EXAMINATION:  The patient has a great deal of slough in 2 of  her wounds.  Otherwise, the wounds are healing relatively well and look  clean, particularly surrounding areas.   DISPOSITION:  The ulceration on the anterior surface of the leg and on  the lateral surface were both debrided of slough using a curette.  Hemostasis was obtained with pressure as bleeding was minimal.   PLAN:  Continue alginate and pressure dressing, see in 7 days.      Joanne Gavel, M.D.  Electronically Signed     RA/MEDQ  D:  07/09/2008  T:  07/09/2008  Job:  [id number]
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
## 2010-07-13 NOTE — Assessment & Plan Note (Signed)
[facility]   NAME:  Veronica [name], Veronica [name]              ACCOUNT NO.:  [id number]   MEDICAL RECORD NO.:  [id number]      DATE OF BIRTH:  [birth year]   PHYSICIAN:  Maxwell Caul, M.D. VISIT DATE:  04/17/2008                                   OFFICE VISIT   [name] is a lady who we have been following for a wound on her right  lateral leg.  This was likely to be some combination of cellulitis,  perhaps venous insufficiency.  Initially, this wound cultured MRSA.  We  gave her doxycycline.  The cellulitic part of this presentation  resolved.  We have most recently been using a Prisma hydrogel under a  Profore Lite wrap.   She returns today unfortunately with some deterioration.  There was not  pain, however, she stated the wound was quite pruritic.  In any case,  there is a fair amount of surrounding erythema and swelling around the  wound and 2 new open areas.  The original wound itself does not look too  unhealthy.  This was lightly debrided off some surface slough.  The  wound was blindly cultured for CNS.  However, I have no doubt there is  recurrent infection here probably MRSA.   IMPRESSION:  Cellulitis, right leg and now with 2 smaller open areas.  We applied Prisma to all of the open areas.  I prescribed doxycycline  and rifampin.  We reapplied the Profore Lite, and we will see her back  early next week for followup of wound infection.           ______________________________  Maxwell Caul, M.D.     MGR/MEDQ  D:  04/17/2008  T:  04/17/2008  Job:  [id number]

## 2010-07-13 NOTE — Assessment & Plan Note (Signed)
[facility]   NAME:  Veronica [name], Veronica [name]              ACCOUNT NO.:  [id number]   MEDICAL RECORD NO.:  [id number]      DATE OF BIRTH:  [birth year]   PHYSICIAN:  Maxwell Caul, M.D. VISIT DATE:  04/07/2008                                   OFFICE VISIT   Mrs. Veronica [name] is a lady we have been following for a wound on her right  lateral leg.  This is likely to be some combination of secondary to  cellulitis and venous insufficiency.  We have been serially debriding  this both mechanically and with Santyl.  Cultures of this wound grew  methicillin-resistant Staph aureus.  However, she has not been  systemically ill.  We treated this initially with oral antibiotics  (doxycycline).   On examination, the wound measures 5.1 x 2.7 x 0.2.  This is actually  larger than previous measurements although the wound does not look  visually changed to me.  This underwent a light mechanical debridement  to remove some surface eschar.   IMPRESSION:  Wound related to venous stasis and likely cellulitis.  I  felt the wound bed was clean enough to change from Santyl to a collagen  based dressing.  We used prisma, hydrogel, dry gauze under a Profore  Lite.  We will see her again in 10 days time at her son's request who  transports her.           ______________________________  Maxwell Caul, M.D.     MGR/MEDQ  D:  04/07/2008  T:  04/08/2008  Job:  [id number]

## 2010-07-13 NOTE — Assessment & Plan Note (Signed)
[facility]   NAME:  Veronica [name], Veronica [name]              ACCOUNT NO.:  [id number]   MEDICAL RECORD NO.:  [id number]      DATE OF BIRTH:  1912-04-28   PHYSICIAN:  Jonelle Sports. Sevier, M.D.  VISIT DATE:  08/06/2008                                   OFFICE VISIT   HISTORY:  This 77 year old white female is followed for a traumatically-  induced wound on the distal pretibial area on the right and several  little satellite superficial ulcers that have developed lateral to the  primary wound.  Culture made 2 weeks ago revealed MRSA and cephalexin on  which I had placed her at that time was not effective and she was  changed to doxycycline which she had been taking 100 mg p.o. b.i.d.  since that time.  She reports no increase in drainage, no pain, no odor,  and reports she has been faithful in elevating her extremity.   PHYSICAL EXAMINATION:  Blood pressure 153/81, pulse 71, respirations 17,  temperature 98.4.  The principle wound now measures 2.3 x 2.7 x 0.1 cm  which is actually slightly smaller.  There is a little surrounding  erythema and some mild slough in the base related to the [facility] which  she has been using.  The lateral wounds are crusted but essentially  healed.   IMPRESSION:  Satisfactory course with hopefully more improvement  anticipated with the change to doxycycline.   DISPOSITION:  The principle wound is debrided using a small scalpel and  gets back to a fairly clean base.  The lateral wounds are debrided  selectively of the crust with no difficulty there.   The minor wounds will be treated with application of Neosporin, the  principle wound with Santyl and moist gauze.  The extremity will again  be placed in Kerlix and Coban.   She will continue twice weekly changes by [facility] nurse and her  doxycycline is reduced today 100 mg once daily which will continue for  approximately another 14 days.   Followup visit will be here in 1 week.     ______________________________  Jonelle Sports. Cheryll Cockayne, M.D.     RES/MEDQ  D:  08/06/2008  T:  08/07/2008  Job:  [id number]

## 2010-07-13 NOTE — Assessment & Plan Note (Signed)
[facility]   NAME:  Veronica [name], Veronica [name]              ACCOUNT NO.:  [id number]   MEDICAL RECORD NO.:  [id number]      DATE OF BIRTH:  [birth year]   PHYSICIAN:  Leonie Man, M.D.    VISIT DATE:  04/29/2008                                   OFFICE VISIT   PROBLEM:  Venous stasis disease with ulceration in the right lateral leg  superior to the malleolus.   [name] is a 77 year old lady with a right lateral leg wounds.  The  etiology of this wounds is unknown, but may have been caused by a  traumatic scratch.  There is also some element of venous stasis involved  here.  We saw her last on April 22, 2008, which time we did a washing  and some mechanical debridement of the wound with a scrub brush and then  I used a curette to debride the fibrin from the wound.   On today's evaluation, the wound is healing quite well.  There is an  area of eschar over the inferior portion of the wound and there is  significant amount of fibrinous slough.  The wound is smaller.  There is  no odor or any significant exudate.   Following prepping, I infiltrated the region around the wound in a field  block, so as to minimize discomfort.  I did a next fairly extensive  debridement of the eschar and used a curette to remove most of the  fibrinous exudate.  We then placed her on a Santyl with a dampened  gauze, absorptive pad, and a Profore Lite.  I will take another look at  her wound again in 1 week just to monitor her progress.      Leonie Man, M.D.  Electronically Signed     PB/MEDQ  D:  04/29/2008  T:  04/29/2008  Job:  [id number]

## 2010-07-14 ENCOUNTER — Encounter (HOSPITAL_BASED_OUTPATIENT_CLINIC_OR_DEPARTMENT_OTHER): Payer: Medicare Other | Attending: Plastic Surgery

## 2010-07-14 DIAGNOSIS — L97309 Non-pressure chronic ulcer of unspecified ankle with unspecified severity: Secondary | ICD-10-CM | POA: Insufficient documentation

## 2010-07-14 DIAGNOSIS — Z79899 Other long term (current) drug therapy: Secondary | ICD-10-CM | POA: Insufficient documentation

## 2010-07-14 DIAGNOSIS — Z96619 Presence of unspecified artificial shoulder joint: Secondary | ICD-10-CM | POA: Insufficient documentation

## 2010-07-14 DIAGNOSIS — Z8614 Personal history of Methicillin resistant Staphylococcus aureus infection: Secondary | ICD-10-CM | POA: Insufficient documentation

## 2010-08-11 ENCOUNTER — Encounter (HOSPITAL_BASED_OUTPATIENT_CLINIC_OR_DEPARTMENT_OTHER): Payer: Medicare Other | Attending: Plastic Surgery

## 2010-08-11 DIAGNOSIS — Z79899 Other long term (current) drug therapy: Secondary | ICD-10-CM | POA: Insufficient documentation

## 2010-08-11 DIAGNOSIS — L97309 Non-pressure chronic ulcer of unspecified ankle with unspecified severity: Secondary | ICD-10-CM | POA: Insufficient documentation

## 2010-08-11 DIAGNOSIS — Z96619 Presence of unspecified artificial shoulder joint: Secondary | ICD-10-CM | POA: Insufficient documentation

## 2010-08-11 DIAGNOSIS — Z8614 Personal history of Methicillin resistant Staphylococcus aureus infection: Secondary | ICD-10-CM | POA: Insufficient documentation

## 2010-08-11 NOTE — Assessment & Plan Note (Unsigned)
[facility]  NAME:  Veronica [name], Veronica [name]              ACCOUNT NO.:  [id number]  MEDICAL RECORD NO.:  [id number]      DATE OF BIRTH:  1912/08/18  PHYSICIAN:  Wayland Denis, DO       VISIT DATE:  08/11/2010                                  OFFICE VISIT   Veronica [name] is a 77 year old white female who is here with her son for reevaluation of her right lower extremity ankle wound on the lateral aspect.  At today's visit, she has healed and done extremely well. There is no sign of infection.  There is just a little bit of redness, but overall she has done extremely well.  She had been using the wraps, but the most recent wrap that was placed was too tight and had to be cut off, but for the past week she has not had any.  She is very pleased with the progress.  PHYSICAL EXAMINATION:  GENERAL:  She is alert and oriented, cooperative, very pleasant like usual. HEENT:  Her pupils are equal. LUNGS:  Her breathing is unlabored. HEART:  Regular. ABDOMEN:  Soft. EXTREMITIES:  Lower extremity wound is healed.  ASSESSMENT:  Right lower extremity wound, healed.  PLAN:  A little bit of antibiotic ointment for the last bit of redness and then Eucerin cream with compression stockings to the knees during all the time for the next week and then during the day and let us know if there is any change.     Wayland Denis, DO     CS/MEDQ  D:  08/11/2010  T:  08/11/2010  Job:  [id number]

## 2013-10-16 ENCOUNTER — Ambulatory Visit
Admission: RE | Admit: 2013-10-16 | Discharge: 2013-10-16 | Disposition: A | Discharge status: Home / Self Care | Payer: Medicare Other | Source: Ambulatory Visit | Attending: Internal Medicine | Admitting: Internal Medicine

## 2013-10-16 ENCOUNTER — Other Ambulatory Visit: Payer: Self-pay | Admitting: Internal Medicine

## 2013-10-16 DIAGNOSIS — M25511 Pain in right shoulder: Secondary | ICD-10-CM

## 2014-05-02 ENCOUNTER — Encounter: Payer: Self-pay | Admitting: *Deleted

## 2014-05-02 ENCOUNTER — Ambulatory Visit (INDEPENDENT_AMBULATORY_CARE_PROVIDER_SITE_OTHER): Payer: Medicare Other | Admitting: Internal Medicine

## 2014-05-02 ENCOUNTER — Encounter: Payer: Self-pay | Admitting: Internal Medicine

## 2014-05-02 VITALS — BP 120/60 | HR 44

## 2014-05-02 DIAGNOSIS — R634 Abnormal weight loss: Secondary | ICD-10-CM

## 2014-05-02 DIAGNOSIS — R9431 Abnormal electrocardiogram [ECG] [EKG]: Secondary | ICD-10-CM

## 2014-05-02 DIAGNOSIS — K8020 Calculus of gallbladder without cholecystitis without obstruction: Secondary | ICD-10-CM | POA: Diagnosis not present

## 2014-05-02 DIAGNOSIS — G4700 Insomnia, unspecified: Secondary | ICD-10-CM

## 2014-05-02 DIAGNOSIS — R001 Bradycardia, unspecified: Secondary | ICD-10-CM

## 2014-05-02 DIAGNOSIS — H9190 Unspecified hearing loss, unspecified ear: Secondary | ICD-10-CM | POA: Diagnosis not present

## 2014-05-02 DIAGNOSIS — F418 Other specified anxiety disorders: Secondary | ICD-10-CM | POA: Insufficient documentation

## 2014-05-02 DIAGNOSIS — N3281 Overactive bladder: Secondary | ICD-10-CM

## 2014-05-02 DIAGNOSIS — R413 Other amnesia: Secondary | ICD-10-CM | POA: Diagnosis not present

## 2014-05-02 DIAGNOSIS — M129 Arthropathy, unspecified: Secondary | ICD-10-CM | POA: Diagnosis not present

## 2014-05-02 NOTE — Progress Notes (Signed)
Patient ID: Veronica [name], female   DOB: 1912-03-01, 81 y.o.   MRN: [id number]    Facility  [facility]    Place of Service:   OFFICE   No Known Allergies  Chief Complaint  Patient presents with  . Establish Care    HPI:  81 yo female seen today as a new pt. Son is present he is HCPOA. [place]. [place] Son is c/a weight loss of about 20lbs in last 2 yrs. She was dx with gallstones 3 yrs ago and has reduced appetite. No N/V. No abdominal pain. Sx not indicated due to her advanced age. She takes HCTZ for unknown reason. lexapro has helped depression and anxiety. elavil helps her sleep at night. She has never tried an appetite stimulant. She is a poor historian due to memory loss. Son provided hx.  Past Medical History  Diagnosis Date  . Hx of recurrent vertebral fractures   . Anxiety   . History of shingles     severe; abdominal   Past Surgical History  Procedure Laterality Date  . Eye surgery      cataracts  . Fracture surgery Right 2012    Scott Dean; hip repair  . Fracture surgery Right 2011    shoulder shattered due to fall   Family History  Problem Relation Age of Onset  . Stroke Mother   . Stroke Father   . COPD Sister    History   Social History  . Marital Status: Widowed    Spouse Name: N/A  . Number of Children: N/A  . Years of Education: N/A   Occupational History  . Not on file.   Social History Main Topics  . Smoking status: Never Smoker   . Smokeless tobacco: Not on file  . Alcohol Use: No  . Drug Use: No  . Sexual Activity: Not Currently   Other Topics Concern  . Not on file   Social History Narrative   Past medical, surgical, family and social history reviewed  Medications: Patient's Medications  New Prescriptions   No medications on file  Previous Medications   AMITRIPTYLINE (ELAVIL) 10 MG TABLET    Take 10 mg by mouth at bedtime.   CELECOXIB (CELEBREX) 100 MG  CAPSULE    Take 100 mg by mouth 2 (two) times daily.   ESCITALOPRAM (LEXAPRO) 10 MG TABLET    Take 10 mg by mouth daily.   HYDROCHLOROTHIAZIDE (MICROZIDE) 12.5 MG CAPSULE    Take 12.5 mg by mouth daily.   MULTIPLE VITAMINS-MINERALS ([employer] SPECTRAVITE ADULT 50+) TABS    Take 1 tablet by mouth daily.   SOLIFENACIN (VESICARE) 5 MG TABLET    Take 5 mg by mouth daily.  Modified Medications   No medications on file  Discontinued Medications   No medications on file     Review of Systems  Constitutional: Positive for appetite change (reduced) and unexpected weight change (weight loss (down 20 lbs in last 2 yrs)). Negative for fever, chills, diaphoresis, activity change and fatigue.  HENT: Positive for hearing loss (wears hearing aids) and rhinorrhea. Negative for dental problem (she wears dentures), ear pain and sore throat.   Eyes: Negative for visual disturbance.  Respiratory: Positive for cough. Negative for chest tightness and shortness of breath.   Cardiovascular: Negative for chest pain, palpitations and leg swelling.  Gastrointestinal: Negative for nausea, vomiting, abdominal pain, diarrhea, constipation and blood in stool.  Genitourinary: Negative  for dysuria.  Musculoskeletal: Positive for arthralgias (right snkle pain; restricted movement) and gait problem.       (+) osteoporosis; joint deformities and stiffness  Neurological: Negative for dizziness, tremors, numbness and headaches.  Psychiatric/Behavioral: Positive for confusion and sleep disturbance (difficulty falling asleep). The patient is nervous/anxious.        Feels depressed    Filed Vitals:   05/02/14 1454  BP: 120/60  (90/60 by CMA)  Pulse: 44  And irregular (54 by CMA)   There is no weight on file to calculate BMI.  Physical Exam  Constitutional:  Frail appearing in NAD  HENT:  Mouth/Throat: Oropharynx is clear and moist. No oropharyngeal exudate.  Eyes: Pupils are equal, round, and reactive to light. No scleral  icterus.  Neck: Neck supple. No tracheal deviation present. No thyromegaly present.  Cardiovascular: Normal heart sounds and intact distal pulses.  An irregular rhythm present. Bradycardia present.  Exam reveals no gallop and no friction rub.   No murmur heard. No LE edema b/l. no calf TTP. No carotid bruit b/l  Pulmonary/Chest: Effort normal and breath sounds normal. No stridor. No respiratory distress. She has no wheezes. She has no rales.  Abdominal: Soft. Bowel sounds are normal. She exhibits no distension and no mass. There is no tenderness (no RUQ TTP). There is no rebound and no guarding.  Musculoskeletal: She exhibits edema and tenderness.  Markedly reduced ROM right shoulder with bony deformity and atrophy of muscles  Lymphadenopathy:    She has no cervical adenopathy.  Neurological: She is alert. She has normal reflexes.  Skin: Skin is warm and dry. No rash noted.  Psychiatric: She has a normal mood and affect. Her behavior is normal.     Labs reviewed: None recently  ECG reviewed by myself: 1st degree AVB @ 62 bpm, LAD, frequent PVCs, prolonged QTc at , T wave inverted anteriorly. No other ECG available to compare.  Assessment/Plan   ICD-9-CM ICD-10-CM   1. Nonspecific abnormal electrocardiogram (ECG) (EKG) 794.31 R94.31    prolonged QTc; PVCs probably med induced (elavil +/- lexapro)  2. Bradycardia - probably med induced 427.89 R00.1 EKG 12-Lead     CBC with Differential     CMP     TSH     Urinalysis, dipstick only  3. Calculus of gallbladder without cholecystitis without obstruction 574.20 K80.20    dx in 2012; no surgery performed due to age of pt  4. Loss of weight of questionable etiology with some degree of failure-to-thrive but r/o metabolic cause 783.21 R63.4 CBC with Differential     CMP     TSH     Urinalysis, dipstick only  5. Arthritis, multiple joint involvement - stable 716.99 M12.9   6. Depression with anxiety - controlled 300.4 F41.8   7. OAB  (overactive bladder) - stable 596.51 N32.81   8. Memory loss, short term probable senile related 780.93 R41.3   9. Hearing loss, unspecified laterality 389.9 H91.90    wears hearing aids  10. Insomnia - stable 780.52 G47.00     --Taper off amitriptyline and lexapro due to abnormal tracing of the heart. Take both medications every other day x 1 week then every 2 days x 1 week then every 3 days x 1 week then every 4 days x 1 week then every 5 days x 1 week then every 6 days x 1 week then every 7 days x 1 week and stop.  --Follow up in 2 weeks to reck  heart rate  --Continue other medications as ordered  --Push fluids   --Go to the ER if chest pain, shortness of breath or palpitations/skipping heart beat. Declined ER recommendation today  Pilot Prindle S. Ancil Linsey  [facility] [address] [place], [place] [address] [phone] Office (Wednesdays and Fridays 8 AM - 5 PM) [phone] Cell (Monday-Friday 8 AM - 5 PM)

## 2014-05-02 NOTE — Patient Instructions (Signed)
Taper off amitriptyline and lexapro due to abnormal tracing of the heart. Take both medications every other day x 1 week then every 2 days x 1 week then every 3 days x 1 week then every 4 days x 1 week then every 5 days x 1 week then every 6 days x 1 week then every 7 days x 1 week and stop.  Follow up in 2 weeks to reck heart rate  Continue other medications as ordered  Push fluids   Go to the ER if chest pain, shortness of breath or palpitations/skipping heart beat

## 2014-05-03 LAB — COMPREHENSIVE METABOLIC PANEL
ALK PHOS: 130 IU/L — AB (ref 39–117)
ALT: 19 IU/L (ref 0–32)
AST: 32 IU/L (ref 0–40)
Albumin/Globulin Ratio: 1.4 (ref 1.1–2.5)
Albumin: 4.2 g/dL (ref 3.2–4.6)
BILIRUBIN TOTAL: 0.7 mg/dL (ref 0.0–1.2)
BUN/Creatinine Ratio: 21 (ref 11–26)
BUN: 25 mg/dL (ref 10–36)
CO2: 23 mmol/L (ref 18–29)
Calcium: 10.2 mg/dL (ref 8.7–10.3)
Chloride: 97 mmol/L (ref 97–108)
Creatinine, Ser: 1.21 mg/dL — ABNORMAL HIGH (ref 0.57–1.00)
GFR calc Af Amer: 43 mL/min/{1.73_m2} — ABNORMAL LOW (ref >59)
GFR calc non Af Amer: 37 mL/min/{1.73_m2} — ABNORMAL LOW (ref >59)
GLUCOSE: 92 mg/dL (ref 65–99)
Globulin, Total: 2.9 g/dL (ref 1.5–4.5)
POTASSIUM: 4.3 mmol/L (ref 3.5–5.2)
SODIUM: 140 mmol/L (ref 134–144)
Total Protein: 7.1 g/dL (ref 6.0–8.5)

## 2014-05-03 LAB — CBC WITH DIFFERENTIAL/PLATELET
BASOS: 0 %
Basophils Absolute: 0 10*3/uL (ref 0.0–0.2)
EOS: 1 %
Eosinophils Absolute: 0 10*3/uL (ref 0.0–0.4)
HCT: 44.3 % (ref 34.0–46.6)
Hemoglobin: 14.9 g/dL (ref 11.1–15.9)
IMMATURE GRANULOCYTES: 0 %
Immature Grans (Abs): 0 10*3/uL (ref 0.0–0.1)
Lymphocytes Absolute: 1.8 10*3/uL (ref 0.7–3.1)
Lymphs: 23 %
MCH: 30.6 pg (ref 26.6–33.0)
MCHC: 33.6 g/dL (ref 31.5–35.7)
MCV: 91 fL (ref 79–97)
Monocytes Absolute: 0.4 10*3/uL (ref 0.1–0.9)
Monocytes: 5 %
NEUTROS ABS: 5.6 10*3/uL (ref 1.4–7.0)
Neutrophils Relative %: 71 %
Platelets: 319 10*3/uL (ref 150–379)
RBC: 4.87 x10E6/uL (ref 3.77–5.28)
RDW: 13.7 % (ref 12.3–15.4)
WBC: 7.8 10*3/uL (ref 3.4–10.8)

## 2014-05-03 LAB — TSH: TSH: 1.06 u[IU]/mL (ref 0.450–4.500)

## 2014-05-16 ENCOUNTER — Encounter: Payer: Self-pay | Admitting: Internal Medicine

## 2014-05-16 ENCOUNTER — Ambulatory Visit (INDEPENDENT_AMBULATORY_CARE_PROVIDER_SITE_OTHER): Payer: Medicare Other | Admitting: Internal Medicine

## 2014-05-16 VITALS — BP 110/62 | HR 62 | Temp 97.5°F | Wt 109.0 lb

## 2014-05-16 DIAGNOSIS — G4700 Insomnia, unspecified: Secondary | ICD-10-CM

## 2014-05-16 DIAGNOSIS — R001 Bradycardia, unspecified: Secondary | ICD-10-CM

## 2014-05-16 DIAGNOSIS — F418 Other specified anxiety disorders: Secondary | ICD-10-CM | POA: Diagnosis not present

## 2014-05-16 DIAGNOSIS — M129 Arthropathy, unspecified: Secondary | ICD-10-CM

## 2014-05-16 DIAGNOSIS — R9431 Abnormal electrocardiogram [ECG] [EKG]: Secondary | ICD-10-CM

## 2014-05-16 NOTE — Patient Instructions (Addendum)
May take Melatonin 3mg  at bedtime to help sleep  Continue hydrochlorothiazide, vesicare and celebrex and caltrate  Push fluids especially water  Follow up in 1 month

## 2014-05-16 NOTE — Progress Notes (Signed)
Patient ID: Veronica [name], female   DOB: 07-Feb-1913, 81 y.o.   MRN: [id number]    Facility  [facility]    Place of Service:   OFFICE   No Known Allergies  Chief Complaint  Patient presents with  . Medical Management of Chronic Issues    2 week follow-up on heart rate after change in medications. Here with son Casimiro Needle.  Has been at other son's house, he doesn't know what she has been taking.    HPI:  81 yo female seen today for f/u abnormal ECG. She had prolonged QTc due to elavil and lexapro. She has not tapered off medications in the last several days. She recently returned from staying with her youngest son and has not taken any medications. She reports feeling well overall. Her son Casimiro Needle is present. He states pt last took her medications as Rx whens she was at his house a few days ago. He fiils her pill box but noticed she had not taken any medications when he picked her up. HCTZ currently being held due to reduced renal fxn. She is not taking celebrex at all   Past Medical History  Diagnosis Date  . Hx of recurrent vertebral fractures   . Anxiety   . History of shingles     severe; abdominal   Past Surgical History  Procedure Laterality Date  . Eye surgery      cataracts  . Fracture surgery Right 2012    Scott Dean; hip repair  . Fracture surgery Right 2011    shoulder shattered due to fall   History   Social History  . Marital Status: Widowed    Spouse Name: N/A  . Number of Children: N/A  . Years of Education: N/A   Social History Main Topics  . Smoking status: Never Smoker   . Smokeless tobacco: Never Used  . Alcohol Use: No  . Drug Use: No  . Sexual Activity: Not Currently   Other Topics Concern  . None   Social History Narrative     Medications: Patient's Medications  New Prescriptions   No medications on file  Previous Medications   AMITRIPTYLINE (ELAVIL) 10 MG TABLET    Take 10 mg by mouth. Taking one every third day at bedtime   CELECOXIB  (CELEBREX) 100 MG CAPSULE    Take 100 mg by mouth 2 (two) times daily.   ESCITALOPRAM (LEXAPRO) 10 MG TABLET    Take 10 mg by mouth daily.   HYDROCHLOROTHIAZIDE (MICROZIDE) 12.5 MG CAPSULE    Take 12.5 mg by mouth daily.   MULTIPLE VITAMINS-MINERALS ([employer] SPECTRAVITE ADULT 50+) TABS    Take 1 tablet by mouth daily.   SOLIFENACIN (VESICARE) 5 MG TABLET    Take 5 mg by mouth daily.  Modified Medications   No medications on file  Discontinued Medications   No medications on file     Review of Systems  Constitutional: Negative for fever, chills, diaphoresis, activity change, appetite change and fatigue.  HENT: Negative for ear pain and sore throat.   Eyes: Negative for visual disturbance.  Respiratory: Negative for cough, chest tightness and shortness of breath.   Cardiovascular: Negative for chest pain, palpitations and leg swelling.  Gastrointestinal: Negative for nausea, vomiting, abdominal pain, diarrhea, constipation and blood in stool.  Genitourinary: Negative for dysuria.  Musculoskeletal: Positive for arthralgias.  Skin: Negative for rash.  Neurological: Negative for dizziness, tremors, weakness, numbness and headaches.  Psychiatric/Behavioral: Negative for sleep disturbance. The patient is  not nervous/anxious.     Filed Vitals:   05/16/14 1432  BP: 110/62  Pulse: 62  Temp: 97.5 F (36.4 C)  TempSrc: Oral  Weight: 109 lb (49.442 kg)  SpO2: 98%   Body mass index is 19.07 kg/(m^2).  Physical Exam  Constitutional: No distress.  Frail appearing in NAD. Sitting in chair  Cardiovascular: Normal rate and normal pulses.  An irregularly irregular rhythm present. Frequent extrasystoles are present. PMI is not displaced.  Exam reveals no gallop, no distant heart sounds and no friction rub.   Murmur heard.  Systolic murmur is present with a grade of 2/6  No LE swelling b/l. No calf TTP  Skin: Skin is warm, dry and intact. No rash noted. She is not diaphoretic.  Psychiatric: She  has a normal mood and affect. Her speech is normal and behavior is normal. Judgment and thought content normal. Cognition and memory are normal.     Labs reviewed: Office Visit on 05/02/2014  Component Date Value Ref Range Status  . WBC 05/02/2014 7.8  3.4 - 10.8 x10E3/uL Final  . RBC 05/02/2014 4.87  3.77 - 5.28 x10E6/uL Final  . Hemoglobin 05/02/2014 14.9  11.1 - 15.9 g/dL Final  . HCT 16/11/9602 44.3  34.0 - 46.6 % Final  . MCV 05/02/2014 91  79 - 97 fL Final  . MCH 05/02/2014 30.6  26.6 - 33.0 pg Final  . MCHC 05/02/2014 33.6  31.5 - 35.7 g/dL Final  . RDW 54/10/8117 13.7  12.3 - 15.4 % Final  . Platelets 05/02/2014 319  150 - 379 x10E3/uL Final  . Neutrophils Relative % 05/02/2014 71   Final  . Lymphs 05/02/2014 23   Final  . Monocytes 05/02/2014 5   Final  . Eos 05/02/2014 1   Final  . Basos 05/02/2014 0   Final  . Neutrophils Absolute 05/02/2014 5.6  1.4 - 7.0 x10E3/uL Final  . Lymphocytes Absolute 05/02/2014 1.8  0.7 - 3.1 x10E3/uL Final  . Monocytes Absolute 05/02/2014 0.4  0.1 - 0.9 x10E3/uL Final  . Eosinophils Absolute 05/02/2014 0.0  0.0 - 0.4 x10E3/uL Final  . Basophils Absolute 05/02/2014 0.0  0.0 - 0.2 x10E3/uL Final  . Immature Granulocytes 05/02/2014 0   Final  . Immature Grans (Abs) 05/02/2014 0.0  0.0 - 0.1 x10E3/uL Final  . Glucose 05/02/2014 92  65 - 99 mg/dL Final  . BUN 14/78/2956 25  10 - 36 mg/dL Final  . Creatinine, Ser 05/02/2014 1.21* 0.57 - 1.00 mg/dL Final  . GFR calc non Af Amer 05/02/2014 37* >59 mL/min/1.73 Final  . GFR calc Af Amer 05/02/2014 43* >59 mL/min/1.73 Final  . BUN/Creatinine Ratio 05/02/2014 21  11 - 26 Final  . Sodium 05/02/2014 140  134 - 144 mmol/L Final  . Potassium 05/02/2014 4.3  3.5 - 5.2 mmol/L Final  . Chloride 05/02/2014 97  97 - 108 mmol/L Final  . CO2 05/02/2014 23  18 - 29 mmol/L Final  . Calcium 05/02/2014 10.2  8.7 - 10.3 mg/dL Final  . Total Protein 05/02/2014 7.1  6.0 - 8.5 g/dL Final  . Albumin 21/30/8657 4.2  3.2  - 4.6 g/dL Final  . Globulin, Total 05/02/2014 2.9  1.5 - 4.5 g/dL Final  . Albumin/Globulin Ratio 05/02/2014 1.4  1.1 - 2.5 Final  . Bilirubin Total 05/02/2014 0.7  0.0 - 1.2 mg/dL Final  . Alkaline Phosphatase 05/02/2014 130* 39 - 117 IU/L Final  . AST 05/02/2014 32  0 - 40 IU/L  Final  . ALT 05/02/2014 19  0 - 32 IU/L Final  . TSH 05/02/2014 1.060  0.450 - 4.500 uIU/mL Final   ECG reviewed by myself- 1st degree AVB@ 66 bpm, LAD, LAE, NSST changes, IVCD, LAFB, prolonged QTc 459ms. No acute ischemic changes. Improved QTc since last tracing  Assessment/Plan    ICD-9-CM ICD-10-CM   1. Nonspecific abnormal electrocardiogram (ECG) (EKG) improving QTc interval; now has LAFB 794.31 R94.31 EKG 12-Lead  2. Insomnia - uncontrolled off elavil 780.52 G47.00   3. Arthritis, multiple joint involvement - on celebrex 716.99 M12.9   4. Bradycardia -  427.89 R00.1    resolved  5. Depression with anxiety - stable at this time off lexapro 300.4 F41.8   6.     Renal insufficiency probably med induced +/- volume depletion  --May take Melatonin 3mg  at bedtime to help sleep  --Continue hydrochlorothiazide, vesicare and celebrex and caltrate  --no need to resume tapering lexapro and elavil as she has already stopped them herself completely  --Push fluids especially water  --Follow up in 1 month. [facility]   Giann Obara S. Ancil Linseyarter, D. O., F. A. C. O. I.  [facility] [address] [place], [place] [address] [phone] Office (Wednesdays and Fridays 8 AM - 5 PM) [phone] Cell (Monday-Friday 8 AM - 5 PM)

## 2014-06-18 ENCOUNTER — Encounter: Payer: Self-pay | Admitting: Internal Medicine

## 2014-06-18 ENCOUNTER — Ambulatory Visit (INDEPENDENT_AMBULATORY_CARE_PROVIDER_SITE_OTHER): Payer: Medicare Other | Admitting: Internal Medicine

## 2014-06-18 VITALS — BP 124/80 | HR 61 | Temp 98.6°F | Ht 63.0 in | Wt 107.0 lb

## 2014-06-18 DIAGNOSIS — R634 Abnormal weight loss: Secondary | ICD-10-CM

## 2014-06-18 DIAGNOSIS — F418 Other specified anxiety disorders: Secondary | ICD-10-CM

## 2014-06-18 DIAGNOSIS — R001 Bradycardia, unspecified: Secondary | ICD-10-CM | POA: Diagnosis not present

## 2014-06-18 DIAGNOSIS — G4700 Insomnia, unspecified: Secondary | ICD-10-CM | POA: Diagnosis not present

## 2014-06-18 DIAGNOSIS — M129 Arthropathy, unspecified: Secondary | ICD-10-CM

## 2014-06-18 MED ORDER — ZOSTER VACCINE LIVE 19400 UNT/0.65ML ~~LOC~~ SOLR: 0.6500 mL | Freq: Once | SUBCUTANEOUS | Status: DC

## 2014-06-18 MED ORDER — SOLIFENACIN SUCCINATE 5 MG PO TABS: 5.0000 mg | ORAL_TABLET | Freq: Every day | ORAL | Status: AC

## 2014-06-18 MED ORDER — MIRTAZAPINE 15 MG PO TABS
15.0000 mg | ORAL_TABLET | Freq: Every day | ORAL | Status: AC
Start: 2014-06-18 — End: ?

## 2014-06-18 MED ORDER — TETANUS-DIPHTH-ACELL PERTUSSIS 5-2.5-18.5 LF-MCG/0.5 IM SUSP: 0.5000 mL | Freq: Once | INTRAMUSCULAR | Status: DC

## 2014-06-18 NOTE — Progress Notes (Signed)
Patient ID: Veronica [name], female   DOB: 10/08/1912, 81 y.o.   MRN: [id number]    Facility  [facility]    Place of Service:   OFFICE    No Known Allergies  Chief Complaint  Patient presents with  . Medical Management of Chronic Issues    1 month follow-up  . Weight Loss    Not eating well, never eats full meals  . Fussy    Not sleeping well, makes patient fussy at times. Patient will sleep for several days at a time and then be up and irritable for several days   . Immunizations    Discuss need for prevnar 13     HPI:  81 yo female seen today for f/u. Son reports pt has episodes of staying awake x few days then sleeping for 24 hrs. Pt admits to occasional depressed mood, no anxiety. Appetite poor. She is now completely off elavil. Son reports that she gets really irritable when she does not sleep. EMS called a few weeks ago to c/o severe abdominal pain. She did not go to the hospital as she realized that the strawberries she consumed probably exac gallbladder issues. No further pain.  Needs RF on OAB med vesicare  She is a poor historian due to short term memory loss. Hx obtained from son  Past Medical History  Diagnosis Date  . Hx of recurrent vertebral fractures   . Anxiety   . History of shingles     severe; abdominal   Past Surgical History  Procedure Laterality Date  . Eye surgery      cataracts  . Fracture surgery Right 2012    Scott Dean; hip repair  . Fracture surgery Right 2011    shoulder shattered due to fall   History   Social History  . Marital Status: Widowed    Spouse Name: N/A  . Number of Children: N/A  . Years of Education: N/A   Social History Main Topics  . Smoking status: Never Smoker   . Smokeless tobacco: Never Used  . Alcohol Use: No  . Drug Use: No  . Sexual Activity: Not Currently   Other Topics Concern  . None   Social History Narrative      Medications: Patient's Medications  New Prescriptions   No medications on file    Previous Medications   CELECOXIB (CELEBREX) 100 MG CAPSULE    Take 100 mg by mouth 2 (two) times daily.   HYDROCHLOROTHIAZIDE (MICROZIDE) 12.5 MG CAPSULE    Take 12.5 mg by mouth daily.   MULTIPLE VITAMINS-MINERALS ([employer] SPECTRAVITE ADULT 50+) TABS    Take 1 tablet by mouth daily.   SOLIFENACIN (VESICARE) 5 MG TABLET    Take 5 mg by mouth daily.  Modified Medications   Modified Medication Previous Medication   TDAP (BOOSTRIX) 5-2.5-18.5 LF-MCG/0.5 INJECTION Tdap (BOOSTRIX) 5-2.5-18.5 LF-MCG/0.5 injection      Inject 0.5 mLs into the muscle once.    Inject 0.5 mLs into the muscle once.   ZOSTER VACCINE LIVE, PF, (ZOSTAVAX) 40981 UNT/0.65ML INJECTION zoster vaccine live, PF, (ZOSTAVAX) 19147 UNT/0.65ML injection      Inject 19,400 Units into the skin once.    Inject 0.65 mLs into the skin once.  Discontinued Medications   No medications on file     Review of Systems  Unable to perform ROS: Dementia    Filed Vitals:   06/18/14 1602  BP: 124/80  Pulse: 61  Temp: 98.6 F (37 C)  TempSrc: Oral  Height:  (1.6 m)  Weight: 107 lb (48.535 kg)  SpO2: 97%   Body mass index is 18.96 kg/(m^2).  Physical Exam  Constitutional: She appears well-developed.  Frail appearing in NAD. HOH. Uses cane to ambulate. Son present  Cardiovascular: Intact distal pulses.  An irregularly irregular rhythm present. Bradycardia present.  Exam reveals no gallop, no distant heart sounds and no friction rub.   Murmur heard.  Systolic murmur is present with a grade of 1/6  Pulmonary/Chest: Effort normal. She has no wheezes. She has no rhonchi. She has no rales.  Kyphosis present  Musculoskeletal:  Unsteady gait  Neurological: She is alert.  And awake  Psychiatric: She has a normal mood and affect. Her speech is normal.     Labs reviewed: Office Visit on 05/02/2014  Component Date Value Ref Range Status  . WBC 05/02/2014 7.8  3.4 - 10.8 x10E3/uL Final  . RBC 05/02/2014 4.87  3.77 - 5.28 x10E6/uL  Final  . Hemoglobin 05/02/2014 14.9  11.1 - 15.9 g/dL Final  . HCT 95/62/1308 44.3  34.0 - 46.6 % Final  . MCV 05/02/2014 91  79 - 97 fL Final  . MCH 05/02/2014 30.6  26.6 - 33.0 pg Final  . MCHC 05/02/2014 33.6  31.5 - 35.7 g/dL Final  . RDW 65/78/4696 13.7  12.3 - 15.4 % Final  . Platelets 05/02/2014 319  150 - 379 x10E3/uL Final  . Neutrophils Relative % 05/02/2014 71   Final  . Lymphs 05/02/2014 23   Final  . Monocytes 05/02/2014 5   Final  . Eos 05/02/2014 1   Final  . Basos 05/02/2014 0   Final  . Neutrophils Absolute 05/02/2014 5.6  1.4 - 7.0 x10E3/uL Final  . Lymphocytes Absolute 05/02/2014 1.8  0.7 - 3.1 x10E3/uL Final  . Monocytes Absolute 05/02/2014 0.4  0.1 - 0.9 x10E3/uL Final  . Eosinophils Absolute 05/02/2014 0.0  0.0 - 0.4 x10E3/uL Final  . Basophils Absolute 05/02/2014 0.0  0.0 - 0.2 x10E3/uL Final  . Immature Granulocytes 05/02/2014 0   Final  . Immature Grans (Abs) 05/02/2014 0.0  0.0 - 0.1 x10E3/uL Final  . Glucose 05/02/2014 92  65 - 99 mg/dL Final  . BUN 29/52/8413 25  10 - 36 mg/dL Final  . Creatinine, Ser 05/02/2014 1.21* 0.57 - 1.00 mg/dL Final  . GFR calc non Af Amer 05/02/2014 37* >59 mL/min/1.73 Final  . GFR calc Af Amer 05/02/2014 43* >59 mL/min/1.73 Final  . BUN/Creatinine Ratio 05/02/2014 21  11 - 26 Final  . Sodium 05/02/2014 140  134 - 144 mmol/L Final  . Potassium 05/02/2014 4.3  3.5 - 5.2 mmol/L Final  . Chloride 05/02/2014 97  97 - 108 mmol/L Final  . CO2 05/02/2014 23  18 - 29 mmol/L Final  . Calcium 05/02/2014 10.2  8.7 - 10.3 mg/dL Final  . Total Protein 05/02/2014 7.1  6.0 - 8.5 g/dL Final  . Albumin 24/40/1027 4.2  3.2 - 4.6 g/dL Final  . Globulin, Total 05/02/2014 2.9  1.5 - 4.5 g/dL Final  . Albumin/Globulin Ratio 05/02/2014 1.4  1.1 - 2.5 Final  . Bilirubin Total 05/02/2014 0.7  0.0 - 1.2 mg/dL Final  . Alkaline Phosphatase 05/02/2014 130* 39 - 117 IU/L Final  . AST 05/02/2014 32  0 - 40 IU/L Final  . ALT 05/02/2014 19  0 - 32 IU/L  Final  . TSH 05/02/2014 1.060  0.450 - 4.500 uIU/mL Final  Assessment/Plan   ICD-9-CM ICD-10-CM   1. Bradycardia - improved off TCA 427.89 R00.1   2. Loss of weight 783.21 R63.4   3. Insomnia - off elavil 780.52 G47.00   4. Depression with anxiety - worse off elavil 300.4 F41.8   5. Arthritis, multiple joint involvement  716.99 M12.9     --start remeron 15 mg qhs to help sleep and appetite  --continue other meds as rx  --f/u in 2 mos. Son will call if sx's no better. Prefers to wait til next visit for prevnar [employer]   Skylie Hiott S. Ancil Linseyarter, D. O., F. A. C. O. I.  [facility] [address] [place], [place] [address] [phone] Office (Wednesdays and Fridays 8 AM - 5 PM) [phone] Cell (Monday-Friday 8 AM - 5 PM)

## 2014-06-18 NOTE — Patient Instructions (Signed)
Continue current medications as ordered  Follow up in 2 mos

## 2014-08-22 ENCOUNTER — Encounter: Payer: Self-pay | Admitting: Internal Medicine

## 2014-08-22 ENCOUNTER — Ambulatory Visit (INDEPENDENT_AMBULATORY_CARE_PROVIDER_SITE_OTHER): Payer: Medicare Other | Admitting: Internal Medicine

## 2014-08-22 VITALS — BP 120/84 | HR 64 | Temp 97.4°F | Resp 20 | Ht 63.0 in | Wt 106.4 lb

## 2014-08-22 DIAGNOSIS — L89511 Pressure ulcer of right ankle, stage 1: Secondary | ICD-10-CM

## 2014-08-22 DIAGNOSIS — G4700 Insomnia, unspecified: Secondary | ICD-10-CM | POA: Diagnosis not present

## 2014-08-22 DIAGNOSIS — R634 Abnormal weight loss: Secondary | ICD-10-CM | POA: Diagnosis not present

## 2014-08-22 DIAGNOSIS — M129 Arthropathy, unspecified: Secondary | ICD-10-CM | POA: Diagnosis not present

## 2014-08-22 DIAGNOSIS — R001 Bradycardia, unspecified: Secondary | ICD-10-CM

## 2014-08-22 DIAGNOSIS — F418 Other specified anxiety disorders: Secondary | ICD-10-CM

## 2014-08-22 DIAGNOSIS — R413 Other amnesia: Secondary | ICD-10-CM

## 2014-08-22 DIAGNOSIS — N3281 Overactive bladder: Secondary | ICD-10-CM

## 2014-08-22 DIAGNOSIS — H9190 Unspecified hearing loss, unspecified ear: Secondary | ICD-10-CM | POA: Diagnosis not present

## 2014-08-22 MED ORDER — TETANUS-DIPHTH-ACELL PERTUSSIS 5-2.5-18.5 LF-MCG/0.5 IM SUSP: 0.5000 mL | Freq: Once | INTRAMUSCULAR | Status: DC

## 2014-08-22 MED ORDER — ZOSTER VACCINE LIVE 19400 UNT/0.65ML ~~LOC~~ SOLR: 0.6500 mL | Freq: Once | SUBCUTANEOUS | Status: DC

## 2014-08-22 NOTE — Patient Instructions (Addendum)
Wear adult briefs as needed for bladder control comfort  Continue current medications as ordered  Refer back to wound care for eval  Follow up in 1-2 months  Will call with lab results

## 2014-08-22 NOTE — Progress Notes (Signed)
Patient ID: Veronica [name], female   DOB: 1912/07/16, 81 y.o.   MRN: [id number]    Location:    [facility]   Place of Service:   OFFICE  Chief Complaint  Patient presents with  . Medical Management of Chronic Issues    2 month follow-up    HPI:  81 yo female seen today for f/u. She c/o right lateral malleolus pain. She had a wound in the area in the past and was seen at the wound center. She is currently applying topical neosporin. No d/c but it burns at night when she lies down on right side to sleep.  She has right shoulder pain since she broke it several yrs ago. She had PT  She does not eat well due to fear of gallbladder attack. Son requests nutrition eval  Son is c/a urinary/bowel incontinence. He would like her to wear adult briefs to improve QOL but believes she will not listen to him  She wears hearing aids usually but forgot them today. Needs new Rx for Tdap and zostavax as they were misplaced.  Past Medical History  Diagnosis Date  . Hx of recurrent vertebral fractures   . Anxiety   . History of shingles     severe; abdominal    Past Surgical History  Procedure Laterality Date  . Eye surgery      cataracts  . Fracture surgery Right 2012    Scott Dean; hip repair  . Fracture surgery Right 2011    shoulder shattered due to fall    Patient Care Team: Kirt Boys, DO as PCP - General (Internal Medicine)  History   Social History  . Marital Status: Widowed    Spouse Name: N/A  . Number of Children: N/A  . Years of Education: N/A   Occupational History  . Not on file.   Social History Main Topics  . Smoking status: Never Smoker   . Smokeless tobacco: Never Used  . Alcohol Use: No  . Drug Use: No  . Sexual Activity: Not Currently   Other Topics Concern  . Not on file   Social History Narrative     reports that she has never smoked. She has never used smokeless tobacco. She reports that she does not drink alcohol or use illicit drugs.  No Known  Allergies  Medications: Patient's Medications  New Prescriptions   No medications on file  Previous Medications   CELECOXIB (CELEBREX) 100 MG CAPSULE    Take 100 mg by mouth 2 (two) times daily.   ESCITALOPRAM (LEXAPRO) 10 MG TABLET    Take 10 mg by mouth daily.   HYDROCHLOROTHIAZIDE (MICROZIDE) 12.5 MG CAPSULE    Take 12.5 mg by mouth daily.   MIRTAZAPINE (REMERON) 15 MG TABLET    Take 1 tablet (15 mg total) by mouth at bedtime.   MULTIPLE VITAMINS-MINERALS ([employer]S SPECTRAVITE ADULT 50+) TABS    Take 1 tablet by mouth daily.   SOLIFENACIN (VESICARE) 5 MG TABLET    Take 1 tablet (5 mg total) by mouth daily.   TDAP (BOOSTRIX) 5-2.5-18.5 LF-MCG/0.5 INJECTION    Inject 0.5 mLs into the muscle once.   ZOSTER VACCINE LIVE, PF, (ZOSTAVAX) 57846 UNT/0.65ML INJECTION    Inject 19,400 Units into the skin once.  Modified Medications   No medications on file  Discontinued Medications   No medications on file    Review of Systems  Unable to perform ROS: Dementia    Filed Vitals:   08/22/14 9629  BP: 120/84  Pulse: 64  Temp: 97.4 F (36.3 C)  TempSrc: Oral  Resp: 20  Height:  (1.6 m)  Weight: 106 lb 6.4 oz (48.263 kg) (down 6 oz)  SpO2: 98%   Body mass index is 18.85 kg/(m^2).  Physical Exam  Constitutional: She is oriented to person, place, and time. She appears well-developed and well-nourished.  HENT:  Mouth/Throat: Oropharynx is clear and moist. No oropharyngeal exudate.  HOH  Eyes: Pupils are equal, round, and reactive to light. No scleral icterus.  Neck: Neck supple. Carotid bruit is not present. No tracheal deviation present. No thyromegaly present.  Cardiovascular: Normal rate, regular rhythm, normal heart sounds and intact distal pulses.  Exam reveals no gallop and no friction rub.   No murmur heard. No LE edema b/l. no calf TTP.   Pulmonary/Chest: Effort normal and breath sounds normal. No stridor. No respiratory distress. She has no wheezes. She has no rales.    Abdominal: Soft. Bowel sounds are normal. She exhibits no distension and no mass. There is no hepatomegaly. There is tenderness (RUQ). There is no rebound and no guarding.  Lymphadenopathy:    She has no cervical adenopathy.  Neurological: She is alert and oriented to person, place, and time. She has normal reflexes.  Skin: Skin is warm and dry. No rash noted.     Psychiatric: She has a normal mood and affect. Her behavior is normal. Judgment and thought content normal.     Labs reviewed: No visits with results within 3 Month(s) from this visit. Latest known visit with results is:  Office Visit on 05/02/2014  Component Date Value Ref Range Status  . WBC 05/02/2014 7.8  3.4 - 10.8 x10E3/uL Final  . RBC 05/02/2014 4.87  3.77 - 5.28 x10E6/uL Final  . Hemoglobin 05/02/2014 14.9  11.1 - 15.9 g/dL Final  . HCT 16/11/9602 44.3  34.0 - 46.6 % Final  . MCV 05/02/2014 91  79 - 97 fL Final  . MCH 05/02/2014 30.6  26.6 - 33.0 pg Final  . MCHC 05/02/2014 33.6  31.5 - 35.7 g/dL Final  . RDW 54/10/8117 13.7  12.3 - 15.4 % Final  . Platelets 05/02/2014 319  150 - 379 x10E3/uL Final  . Neutrophils Relative % 05/02/2014 71   Final  . Lymphs 05/02/2014 23   Final  . Monocytes 05/02/2014 5   Final  . Eos 05/02/2014 1   Final  . Basos 05/02/2014 0   Final  . Neutrophils Absolute 05/02/2014 5.6  1.4 - 7.0 x10E3/uL Final  . Lymphocytes Absolute 05/02/2014 1.8  0.7 - 3.1 x10E3/uL Final  . Monocytes Absolute 05/02/2014 0.4  0.1 - 0.9 x10E3/uL Final  . Eosinophils Absolute 05/02/2014 0.0  0.0 - 0.4 x10E3/uL Final  . Basophils Absolute 05/02/2014 0.0  0.0 - 0.2 x10E3/uL Final  . Immature Granulocytes 05/02/2014 0   Final  . Immature Grans (Abs) 05/02/2014 0.0  0.0 - 0.1 x10E3/uL Final  . Glucose 05/02/2014 92  65 - 99 mg/dL Final  . BUN 14/78/2956 25  10 - 36 mg/dL Final  . Creatinine, Ser 05/02/2014 1.21* 0.57 - 1.00 mg/dL Final  . GFR calc non Af Amer 05/02/2014 37* >59 mL/min/1.73 Final  . GFR  calc Af Amer 05/02/2014 43* >59 mL/min/1.73 Final  . BUN/Creatinine Ratio 05/02/2014 21  11 - 26 Final  . Sodium 05/02/2014 140  134 - 144 mmol/L Final  . Potassium 05/02/2014 4.3  3.5 - 5.2 mmol/L Final  . Chloride  05/02/2014 97  97 - 108 mmol/L Final  . CO2 05/02/2014 23  18 - 29 mmol/L Final  . Calcium 05/02/2014 10.2  8.7 - 10.3 mg/dL Final  . Total Protein 05/02/2014 7.1  6.0 - 8.5 g/dL Final  . Albumin 28/31/5176 4.2  3.2 - 4.6 g/dL Final  . Globulin, Total 05/02/2014 2.9  1.5 - 4.5 g/dL Final  . Albumin/Globulin Ratio 05/02/2014 1.4  1.1 - 2.5 Final  . Bilirubin Total 05/02/2014 0.7  0.0 - 1.2 mg/dL Final  . Alkaline Phosphatase 05/02/2014 130* 39 - 117 IU/L Final  . AST 05/02/2014 32  0 - 40 IU/L Final  . ALT 05/02/2014 19  0 - 32 IU/L Final  . TSH 05/02/2014 1.060  0.450 - 4.500 uIU/mL Final    No results found.   Assessment/Plan    ICD-9-CM ICD-10-CM   1. Decubitus ulcer of ankle, stage 1, right - not infected today 707.06 L89.511 CMP   707.21  Prealbumin     AMB referral to wound care center  2. Loss of weight - due to reduced po intake 783.21 R63.4 CMP     Prealbumin     Amb ref to Medical Nutrition Therapy-MNT  3. OAB (overactive bladder) with urinary incontinence 596.51 N32.81   4. Arthritis, multiple joint involvement - stable 716.99 M12.9   5. Insomnia - stable 780.52 G47.00   6. Depression with anxiety - stable 300.4 F41.8   7. Memory loss, short term - stable 780.93 R41.3   8. Hearing loss, unspecified laterality - she wears hearing aids 389.9 H91.90   9. Bradycardia 427.89 R00.1    resolved   --Wear adult briefs as needed for bladder control comfort and also bowel incontinence  --Continue current medications as ordered  --Refer back to wound care for eval of lateral malleolus pressure sore  --Follow up in 1-2 months for routine visit  Raeghan Demeter S. Ancil Linsey  [facility] [address] [place],  [place] [address] [phone] Cell (Monday-Friday 8 AM - 5 PM) [phone] After 5 PM and follow prompts

## 2014-08-23 LAB — COMPREHENSIVE METABOLIC PANEL
A/G RATIO: 1.5 (ref 1.1–2.5)
ALT: 12 IU/L (ref 0–32)
AST: 21 IU/L (ref 0–40)
Albumin: 4 g/dL (ref 3.2–4.6)
Alkaline Phosphatase: 105 IU/L (ref 39–117)
BUN / CREAT RATIO: 11 (ref 11–26)
BUN: 12 mg/dL (ref 10–36)
Bilirubin Total: 0.6 mg/dL (ref 0.0–1.2)
CO2: 27 mmol/L (ref 18–29)
CREATININE: 1.07 mg/dL — AB (ref 0.57–1.00)
Calcium: 10.6 mg/dL — ABNORMAL HIGH (ref 8.7–10.3)
Chloride: 96 mmol/L — ABNORMAL LOW (ref 97–108)
GFR, EST AFRICAN AMERICAN: 50 mL/min/{1.73_m2} — AB (ref >59)
GFR, EST NON AFRICAN AMERICAN: 43 mL/min/{1.73_m2} — AB (ref >59)
GLUCOSE: 79 mg/dL (ref 65–99)
Globulin, Total: 2.6 g/dL (ref 1.5–4.5)
POTASSIUM: 4.3 mmol/L (ref 3.5–5.2)
SODIUM: 138 mmol/L (ref 134–144)
Total Protein: 6.6 g/dL (ref 6.0–8.5)

## 2014-08-23 LAB — PREALBUMIN: PREALBUMIN: 17 mg/dL (ref 9–32)

## 2014-09-29 ENCOUNTER — Encounter: Payer: Self-pay | Admitting: Internal Medicine

## 2014-10-03 ENCOUNTER — Telehealth: Payer: Self-pay | Admitting: Internal Medicine

## 2014-10-03 NOTE — Telephone Encounter (Signed)
Noted  

## 2014-10-03 NOTE — Telephone Encounter (Signed)
Patients son Casimiro Needle called and left me a voicemail that his mother has declined going to Wound Care. She has an appointment with you 10/22/14 and he said you can discuss it with her then

## 2014-10-22 ENCOUNTER — Ambulatory Visit (INDEPENDENT_AMBULATORY_CARE_PROVIDER_SITE_OTHER): Payer: Medicare Other | Admitting: Internal Medicine

## 2014-10-22 ENCOUNTER — Encounter: Payer: Self-pay | Admitting: Internal Medicine

## 2014-10-22 VITALS — BP 110/78 | HR 98 | Temp 97.5°F | Resp 18 | Ht 63.0 in | Wt 114.0 lb

## 2014-10-22 DIAGNOSIS — I10 Essential (primary) hypertension: Secondary | ICD-10-CM

## 2014-10-22 DIAGNOSIS — R413 Other amnesia: Secondary | ICD-10-CM

## 2014-10-22 DIAGNOSIS — F418 Other specified anxiety disorders: Secondary | ICD-10-CM

## 2014-10-22 DIAGNOSIS — R634 Abnormal weight loss: Secondary | ICD-10-CM

## 2014-10-22 DIAGNOSIS — M129 Arthropathy, unspecified: Secondary | ICD-10-CM | POA: Diagnosis not present

## 2014-10-22 MED ORDER — CELECOXIB 100 MG PO CAPS: 100.0000 mg | ORAL_CAPSULE | Freq: Two times a day (BID) | ORAL | Status: DC

## 2014-10-22 MED ORDER — ESCITALOPRAM OXALATE 10 MG PO TABS: 10.0000 mg | ORAL_TABLET | Freq: Every day | ORAL | Status: AC

## 2014-10-22 NOTE — Patient Instructions (Signed)
Continue current medications as ordered  Follow up in 3 mos for CPE

## 2014-10-22 NOTE — Progress Notes (Signed)
Patient ID: Veronica [name], female   DOB: October 30, 1912, 81 y.o.   MRN: [id number]    Location[facility]    [facility]   Place of Service:   OFFICE  Chief Complaint  Patient presents with  . Medical Management of Chronic Issues    check left ear for wax,    HPI:  81 yo female seen today for f/u. She reports:  Anxiety/insomnia/depression - mood stable on lexapro. She is not taking remeron  Arthritis - pain is stable overall. She has right shoulder discomfort every so often  Weight loss - she took remeron x  1 month and has not taken any more since. Appetite poor. Drinks ensure 3-4 cans per day. She has not followed up with nutritionist yet  OAB - stable on vesicare. She does not wear depends qhs   Past Medical History  Diagnosis Date  . Hx of recurrent vertebral fractures   . Anxiety   . History of shingles     severe; abdominal    Past Surgical History  Procedure Laterality Date  . Eye surgery      cataracts  . Fracture surgery Right 2012    Scott Dean; hip repair  . Fracture surgery Right 2011    shoulder shattered due to fall    Patient Care Team: Kirt Boys, DO as PCP - General (Internal Medicine)  Social History   Social History  . Marital Status: Widowed    Spouse Name: N/A  . Number of Children: N/A  . Years of Education: N/A   Occupational History  . Not on file.   Social History Main Topics  . Smoking status: Never Smoker   . Smokeless tobacco: Never Used  . Alcohol Use: No  . Drug Use: No  . Sexual Activity: Not Currently   Other Topics Concern  . Not on file   Social History Narrative     reports that she has never smoked. She has never used smokeless tobacco. She reports that she does not drink alcohol or use illicit drugs.  No Known Allergies  Medications: Patient's Medications  New Prescriptions   No medications on file  Previous Medications   CELECOXIB (CELEBREX) 100 MG CAPSULE    Take 100 mg by mouth 2 (two) times daily.   ESCITALOPRAM  (LEXAPRO) 10 MG TABLET    Take 10 mg by mouth daily.   HYDROCHLOROTHIAZIDE (MICROZIDE) 12.5 MG CAPSULE    Take 12.5 mg by mouth daily.   MIRTAZAPINE (REMERON) 15 MG TABLET    Take 1 tablet (15 mg total) by mouth at bedtime.   MULTIPLE VITAMINS-MINERALS ([employer]S SPECTRAVITE ADULT 50+) TABS    Take 1 tablet by mouth daily.   SOLIFENACIN (VESICARE) 5 MG TABLET    Take 1 tablet (5 mg total) by mouth daily.   TDAP (BOOSTRIX) 5-2.5-18.5 LF-MCG/0.5 INJECTION    Inject 0.5 mLs into the muscle once.   ZOSTER VACCINE LIVE, PF, (ZOSTAVAX) 40981 UNT/0.65ML INJECTION    Inject 19,400 Units into the skin once.  Modified Medications   No medications on file  Discontinued Medications   No medications on file    Review of Systems  Unable to perform ROS: Other  short term memory loss  Filed Vitals:   10/22/14 0914  BP: 110/78  Pulse: 98  Temp: 97.5 F (36.4 C)  TempSrc: Oral  Resp: 18  Height: 5\' 3"  (1.6 m)  Weight: 114 lb (51.71 kg) - up 8 lbs  SpO2: 98%   Body mass  index is 20.2 kg/(m^2).  Physical Exam  Constitutional: She appears well-developed and well-nourished.  HENT:  Mouth/Throat: Oropharynx is clear and moist. No oropharyngeal exudate.  TMs intact b/l. nonbulging no redness  Eyes: Pupils are equal, round, and reactive to light. No scleral icterus.  Neck: Neck supple. Carotid bruit is not present. No tracheal deviation present. No thyromegaly present.  Cardiovascular: Normal rate and intact distal pulses.  An irregular rhythm present.  Occasional extrasystoles are present. Exam reveals no gallop and no friction rub.   Murmur heard.  Systolic murmur is present with a grade of 1/6  No LE edema b/l. No calf TTP  Pulmonary/Chest: Effort normal and breath sounds normal. No stridor. No respiratory distress. She has no wheezes. She has no rales.  Abdominal: Soft. Bowel sounds are normal. She exhibits no distension and no mass. There is no hepatomegaly. There is no tenderness. There is no  rebound and no guarding.  Musculoskeletal: She exhibits edema and tenderness.  Lymphadenopathy:    She has no cervical adenopathy.  Neurological: She is alert. She has normal reflexes.  Skin: Skin is warm and dry. No rash noted.  Psychiatric: She has a normal mood and affect. Her behavior is normal.     Labs reviewed: Office Visit on 08/22/2014  Component Date Value Ref Range Status  . Glucose 08/22/2014 79  65 - 99 mg/dL Final  . BUN 16/11/9602 12  10 - 36 mg/dL Final  . Creatinine, Ser 08/22/2014 1.07* 0.57 - 1.00 mg/dL Final  . GFR calc non Af Amer 08/22/2014 43* >59 mL/min/1.73 Final  . GFR calc Af Amer 08/22/2014 50* >59 mL/min/1.73 Final  . BUN/Creatinine Ratio 08/22/2014 11  11 - 26 Final  . Sodium 08/22/2014 138  134 - 144 mmol/L Final  . Potassium 08/22/2014 4.3  3.5 - 5.2 mmol/L Final  . Chloride 08/22/2014 96* 97 - 108 mmol/L Final  . CO2 08/22/2014 27  18 - 29 mmol/L Final  . Calcium 08/22/2014 10.6* 8.7 - 10.3 mg/dL Final  . Total Protein 08/22/2014 6.6  6.0 - 8.5 g/dL Final  . Albumin 54/10/8117 4.0  3.2 - 4.6 g/dL Final  . Globulin, Total 08/22/2014 2.6  1.5 - 4.5 g/dL Final  . Albumin/Globulin Ratio 08/22/2014 1.5  1.1 - 2.5 Final  . Bilirubin Total 08/22/2014 0.6  0.0 - 1.2 mg/dL Final  . Alkaline Phosphatase 08/22/2014 105  39 - 117 IU/L Final  . AST 08/22/2014 21  0 - 40 IU/L Final  . ALT 08/22/2014 12  0 - 32 IU/L Final  . PREALBUMIN 08/22/2014 17  9 - 32 mg/dL Final    No results found.   Assessment/Plan   ICD-9-CM ICD-10-CM   1. Loss of weight - improved; gained 8 lbs 783.21 R63.4   2. Depression with anxiety - stable 300.4 F41.8 escitalopram (LEXAPRO) 10 MG tablet  3. Memory loss, short term - stable 780.93 R41.3   4. Arthritis, multiple joint involvement - stable 716.99 M12.9 celecoxib (CELEBREX) 100 MG capsule  5. Benign essential HTN - stable 401.1 I10     --cont current meds as ordered  --cont ensure supplements  --f/u in 3 mos for  CPE  [facility] S. Ancil Linsey  [facility] [address] [place], [place] [address] [phone] Cell (Monday-Friday 8 AM - 5 PM) [phone] After 5 PM and follow prompts

## 2015-01-10 ENCOUNTER — Emergency Department (HOSPITAL_COMMUNITY): Payer: Medicare Other

## 2015-01-10 ENCOUNTER — Observation Stay (HOSPITAL_COMMUNITY)
Admission: EM | Admit: 2015-01-10 | Discharge: 2015-01-13 | Disposition: A | Discharge status: Skilled Nursing Facility | Payer: Medicare Other | Attending: Internal Medicine | Admitting: Internal Medicine

## 2015-01-10 ENCOUNTER — Encounter (HOSPITAL_COMMUNITY): Payer: Self-pay | Admitting: Emergency Medicine

## 2015-01-10 DIAGNOSIS — S72141A Displaced intertrochanteric fracture of right femur, initial encounter for closed fracture: Principal | ICD-10-CM | POA: Insufficient documentation

## 2015-01-10 DIAGNOSIS — S72001A Fracture of unspecified part of neck of right femur, initial encounter for closed fracture: Secondary | ICD-10-CM | POA: Diagnosis not present

## 2015-01-10 DIAGNOSIS — M25551 Pain in right hip: Secondary | ICD-10-CM | POA: Diagnosis present

## 2015-01-10 DIAGNOSIS — S0101XA Laceration without foreign body of scalp, initial encounter: Secondary | ICD-10-CM | POA: Insufficient documentation

## 2015-01-10 DIAGNOSIS — H9190 Unspecified hearing loss, unspecified ear: Secondary | ICD-10-CM | POA: Diagnosis not present

## 2015-01-10 DIAGNOSIS — Z96641 Presence of right artificial hip joint: Secondary | ICD-10-CM | POA: Insufficient documentation

## 2015-01-10 DIAGNOSIS — I10 Essential (primary) hypertension: Secondary | ICD-10-CM | POA: Insufficient documentation

## 2015-01-10 DIAGNOSIS — W19XXXA Unspecified fall, initial encounter: Secondary | ICD-10-CM | POA: Diagnosis not present

## 2015-01-10 DIAGNOSIS — E876 Hypokalemia: Secondary | ICD-10-CM | POA: Diagnosis not present

## 2015-01-10 DIAGNOSIS — Z66 Do not resuscitate: Secondary | ICD-10-CM | POA: Diagnosis not present

## 2015-01-10 DIAGNOSIS — S0191XA Laceration without foreign body of unspecified part of head, initial encounter: Secondary | ICD-10-CM

## 2015-01-10 DIAGNOSIS — N3281 Overactive bladder: Secondary | ICD-10-CM | POA: Diagnosis present

## 2015-01-10 DIAGNOSIS — N390 Urinary tract infection, site not specified: Secondary | ICD-10-CM | POA: Diagnosis not present

## 2015-01-10 DIAGNOSIS — Z8781 Personal history of (healed) traumatic fracture: Secondary | ICD-10-CM

## 2015-01-10 DIAGNOSIS — F418 Other specified anxiety disorders: Secondary | ICD-10-CM | POA: Diagnosis not present

## 2015-01-10 DIAGNOSIS — S72009A Fracture of unspecified part of neck of unspecified femur, initial encounter for closed fracture: Secondary | ICD-10-CM | POA: Diagnosis present

## 2015-01-10 LAB — BASIC METABOLIC PANEL
ANION GAP: 10 (ref 5–15)
BUN: 12 mg/dL (ref 6–20)
CHLORIDE: 101 mmol/L (ref 101–111)
CO2: 27 mmol/L (ref 22–32)
Calcium: 10.1 mg/dL (ref 8.9–10.3)
Creatinine, Ser: 1.09 mg/dL — ABNORMAL HIGH (ref 0.44–1.00)
GFR, EST AFRICAN AMERICAN: 47 mL/min — AB (ref >60)
GFR, EST NON AFRICAN AMERICAN: 40 mL/min — AB (ref >60)
Glucose, Bld: 99 mg/dL (ref 65–99)
POTASSIUM: 3.9 mmol/L (ref 3.5–5.1)
SODIUM: 138 mmol/L (ref 135–145)

## 2015-01-10 LAB — CBC WITH DIFFERENTIAL/PLATELET
BASOS ABS: 0 10*3/uL (ref 0.0–0.1)
Basophils Relative: 0 %
EOS PCT: 3 %
Eosinophils Absolute: 0.3 10*3/uL (ref 0.0–0.7)
HCT: 43.7 % (ref 36.0–46.0)
HEMOGLOBIN: 14.5 g/dL (ref 12.0–15.0)
LYMPHS ABS: 1.2 10*3/uL (ref 0.7–4.0)
LYMPHS PCT: 16 %
MCH: 30.6 pg (ref 26.0–34.0)
MCHC: 33.2 g/dL (ref 30.0–36.0)
MCV: 92.2 fL (ref 78.0–100.0)
Monocytes Absolute: 0.7 10*3/uL (ref 0.1–1.0)
Monocytes Relative: 9 %
NEUTROS PCT: 72 %
Neutro Abs: 5.4 10*3/uL (ref 1.7–7.7)
PLATELETS: 351 10*3/uL (ref 150–400)
RBC: 4.74 MIL/uL (ref 3.87–5.11)
RDW: 13.2 % (ref 11.5–15.5)
WBC: 7.6 10*3/uL (ref 4.0–10.5)

## 2015-01-10 LAB — PROTIME-INR
INR: 1.06 (ref 0.00–1.49)
PROTHROMBIN TIME: 14 s (ref 11.6–15.2)

## 2015-01-10 LAB — TYPE AND SCREEN
ABO/RH(D): B POS
ANTIBODY SCREEN: NEGATIVE

## 2015-01-10 MED ORDER — ACETAMINOPHEN 325 MG PO TABS
650.0000 mg | ORAL_TABLET | Freq: Four times a day (QID) | ORAL | Status: DC | PRN
  Administered 2015-01-10: 650 mg via ORAL
  Filled 2015-01-10: qty 2

## 2015-01-10 MED ORDER — OXYCODONE HCL 5 MG PO TABS
5.0000 mg | ORAL_TABLET | ORAL | Status: DC | PRN
  Administered 2015-01-10: 5 mg via ORAL
  Filled 2015-01-10: qty 1

## 2015-01-10 MED ORDER — ONDANSETRON HCL 4 MG PO TABS
4.0000 mg | ORAL_TABLET | Freq: Four times a day (QID) | ORAL | Status: DC | PRN
Start: 2015-01-10 — End: 2015-01-13

## 2015-01-10 MED ORDER — ESCITALOPRAM OXALATE 10 MG PO TABS
10.0000 mg | ORAL_TABLET | Freq: Every day | ORAL | Status: DC
  Administered 2015-01-11 – 2015-01-13 (×3): 10 mg via ORAL
  Filled 2015-01-10 (×3): qty 1

## 2015-01-10 MED ORDER — HYDRALAZINE HCL 20 MG/ML IJ SOLN
10.0000 mg | Freq: Four times a day (QID) | INTRAMUSCULAR | Status: DC | PRN
  Filled 2015-01-10: qty 1

## 2015-01-10 MED ORDER — HYDROMORPHONE HCL 1 MG/ML IJ SOLN: 0.5000 mg | INTRAMUSCULAR | Status: DC | PRN

## 2015-01-10 MED ORDER — SODIUM CHLORIDE 0.9 % IJ SOLN: 3.0000 mL | INTRAMUSCULAR | Status: DC | PRN

## 2015-01-10 MED ORDER — DARIFENACIN HYDROBROMIDE ER 7.5 MG PO TB24
7.5000 mg | ORAL_TABLET | Freq: Every day | ORAL | Status: DC
  Administered 2015-01-11 – 2015-01-12 (×2): 7.5 mg via ORAL
  Filled 2015-01-10 (×3): qty 1

## 2015-01-10 MED ORDER — MIRTAZAPINE 15 MG PO TABS
15.0000 mg | ORAL_TABLET | Freq: Every day | ORAL | Status: DC
  Administered 2015-01-10 – 2015-01-13 (×3): 15 mg via ORAL
  Filled 2015-01-10 (×3): qty 1

## 2015-01-10 MED ORDER — SODIUM CHLORIDE 0.9 % IJ SOLN
3.0000 mL | Freq: Two times a day (BID) | INTRAMUSCULAR | Status: DC
Start: 2015-01-10 — End: 2015-01-13

## 2015-01-10 MED ORDER — ALUM & MAG HYDROXIDE-SIMETH 200-200-20 MG/5ML PO SUSP
30.0000 mL | Freq: Four times a day (QID) | ORAL | Status: DC | PRN
Start: 2015-01-10 — End: 2015-01-13

## 2015-01-10 MED ORDER — ONDANSETRON HCL 4 MG/2ML IJ SOLN: 4.0000 mg | Freq: Four times a day (QID) | INTRAMUSCULAR | Status: DC | PRN

## 2015-01-10 MED ORDER — ADULT MULTIVITAMIN W/MINERALS CH
1.0000 | ORAL_TABLET | Freq: Every day | ORAL | Status: DC
  Administered 2015-01-11 – 2015-01-13 (×3): 1 via ORAL
  Filled 2015-01-10 (×5): qty 1

## 2015-01-10 MED ORDER — TETANUS-DIPHTH-ACELL PERTUSSIS 5-2.5-18.5 LF-MCG/0.5 IM SUSP: 0.5000 mL | Freq: Once | INTRAMUSCULAR | Status: DC

## 2015-01-10 MED ORDER — SODIUM CHLORIDE 0.9 % IV SOLN: 250.0000 mL | INTRAVENOUS | Status: DC | PRN

## 2015-01-10 MED ORDER — HYDROCHLOROTHIAZIDE 12.5 MG PO CAPS
12.5000 mg | ORAL_CAPSULE | Freq: Every day | ORAL | Status: DC
  Administered 2015-01-11 – 2015-01-12 (×2): 12.5 mg via ORAL
  Filled 2015-01-10 (×2): qty 1

## 2015-01-10 MED ORDER — ACETAMINOPHEN 650 MG RE SUPP: 650.0000 mg | Freq: Four times a day (QID) | RECTAL | Status: DC | PRN

## 2015-01-10 NOTE — ED Provider Notes (Addendum)
Reviewed results of CT. Discussed patient's care with her son. Also discussed with Dr. Gaylene BrooksBrightman on for Dr. August Saucerean. He advises she is to be nonweightbearing and he will see her in consult. Plan is to admit patient to hospitalist.  Margarita Grizzleanielle Timiya Howells, MD 01/10/15 1926  Patient care discussed with Dr. Lovell SheehanJenkins and Dr. Gaylene BrooksBrightman. Plan admission to MedSurg bed for treatment of right periprosthetic fracture. I discussed CODE STATUS with her son who states he is the medical power of attorney and she is a DO NOT RESUSCITATE.  Margarita Grizzleanielle Zadrian Mccauley, MD 01/10/15 [phone]

## 2015-01-10 NOTE — ED Provider Notes (Signed)
CSN: [id number]     Arrival date & time 01/10/15  1222 History   First MD Initiated Contact with Patient 01/10/15 1237     Chief Complaint  Patient presents with  . Hip Pain   Veronica [name] is a 81 y.o. female with a history of right hip replacement, and anxiety who presents to the emergency department after she slid off of her bed last night complaining of right hip pain. Patient reports she slid off her bed and into an oak dresser around 8:30 PM last night. She reports she "scooted off the bed" and did not actually fall. She reports she hit the back of her head but denies any headache or injury there. She reports she did not know she hit her head until she had blood from the site. She reports having right hip pain this morning when trying to get up to go to bathroom and was able to stand on her right leg. She reports now she has no pain lying in the bed. Patient is not on anticoagulants. The patient denies fevers, loss of consciousness, neck pain, back pain, numbness, tingling, weakness, abdominal pain, nausea, vomiting, chest pain, shortness of breath, lightheadedness, dizziness, headache, double vision, urinary symptoms or rashes.  (Consider location/radiation/quality/duration/timing/severity/associated sxs/prior Treatment) Patient is a 81 y.o. female presenting with hip pain. The history is provided by the patient and a relative. No language interpreter was used.  Hip Pain Associated symptoms include arthralgias. Pertinent negatives include no abdominal pain, chest pain, chills, congestion, coughing, fever, headaches, nausea, neck pain, numbness, rash, sore throat, vomiting or weakness.    Past Medical History  Diagnosis Date  . Hx of recurrent vertebral fractures   . Anxiety   . History of shingles     severe; abdominal   Past Surgical History  Procedure Laterality Date  . Eye surgery      cataracts  . Fracture surgery Right 2012    Scott Dean; hip repair  . Fracture surgery  Right 2011    shoulder shattered due to fall   Family History  Problem Relation Age of Onset  . Stroke Mother   . Stroke Father   . COPD Sister    Social History  Substance Use Topics  . Smoking status: Never Smoker   . Smokeless tobacco: Never Used  . Alcohol Use: No   OB History    No data available     Review of Systems  Constitutional: Negative for fever and chills.  HENT: Negative for congestion and sore throat.   Eyes: Negative for visual disturbance.  Respiratory: Negative for cough, shortness of breath and wheezing.   Cardiovascular: Negative for chest pain.  Gastrointestinal: Negative for nausea, vomiting, abdominal pain and diarrhea.  Genitourinary: Negative for dysuria and difficulty urinating.  Musculoskeletal: Positive for arthralgias and gait problem. Negative for back pain, neck pain and neck stiffness.  Skin: Positive for wound. Negative for rash.  Neurological: Negative for dizziness, syncope, weakness, light-headedness, numbness and headaches.      Allergies  Review of patient's allergies indicates no known allergies.  Home Medications   Prior to Admission medications   Medication Sig Start Date End Date Taking? Authorizing Provider  celecoxib (CELEBREX) 100 MG capsule Take 1 capsule (100 mg total) by mouth 2 (two) times daily. 10/22/14  Yes Monica Carter, DO  escitalopram (LEXAPRO) 10 MG tablet Take 1 tablet (10 mg total) by mouth daily. 10/22/14  Yes Kirt Boys, DO  hydrochlorothiazide (MICROZIDE) 12.5 MG capsule Take  12.5 mg by mouth daily.   Yes Historical Provider, MD  mirtazapine (REMERON) 15 MG tablet Take 1 tablet (15 mg total) by mouth at bedtime. 06/18/14  Yes Kirt Boys, DO  Multiple Vitamins-Minerals ([employer] SPECTRAVITE ADULT 50+) TABS Take 1 tablet by mouth daily.   Yes Historical Provider, MD  solifenacin (VESICARE) 5 MG tablet Take 1 tablet (5 mg total) by mouth daily. 06/18/14  Yes Kirt Boys, DO  Tdap (BOOSTRIX) 5-2.5-18.5 LF-MCG/0.5  injection Inject 0.5 mLs into the muscle once. 08/22/14  Yes Kirt Boys, DO  zoster vaccine live, PF, (ZOSTAVAX) 16109 UNT/0.65ML injection Inject 19,400 Units into the skin once. 08/22/14  Yes Monica Carter, DO   BP 147/69 mmHg  Pulse 121  Temp(Src) 97.9 F (36.6 C) (Oral)  Resp 18  Ht 5\' 4"  (1.626 m)  Wt 110 lb (49.896 kg)  BMI 18.87 kg/m2  SpO2 98% Physical Exam  Constitutional: She is oriented to person, place, and time. She appears well-developed and well-nourished. No distress.  Nontoxic appearing. Hard of hearing.  HENT:  Head: Normocephalic.  Right Ear: External ear normal.  Left Ear: External ear normal.  Nose: Nose normal.  Mouth/Throat: Oropharynx is clear and moist.  Small 2 cm laceration that is well approximated and nonbleeding to her right posterior head. Bleeding is controlled.  Eyes: Conjunctivae and EOM are normal. Pupils are equal, round, and reactive to light. Right eye exhibits no discharge. Left eye exhibits no discharge.  Neck: Normal range of motion. Neck supple. No JVD present. No tracheal deviation present.  No midline neck tenderness.  Cardiovascular: Normal rate, normal heart sounds and intact distal pulses.  Exam reveals no gallop and no friction rub.   Irregularly irregular rhythm. Bilateral radial, posterior tibialis and dorsalis pedis pulses are intact.    Pulmonary/Chest: Effort normal and breath sounds normal. No respiratory distress. She has no wheezes. She has no rales. She exhibits no tenderness.  Lungs clear to auscultation bilaterally. No chest wall tenderness.  Abdominal: Soft. She exhibits no distension. There is no tenderness. There is no guarding.  Musculoskeletal: Normal range of motion. She exhibits tenderness. She exhibits no edema.  Patient is spontaneously moving all extremities in a coordinated fashion exhibiting good strength.  There is tenderness over the lateral aspect of her right thigh. No pain with manipulation of her right hip.  No rotation or shortening of her right leg. Patient has good strength in her bilateral lower extremities. All major joints are supple and nontender to palpation. No deformity noted. No midline back tenderness.  Lymphadenopathy:    She has no cervical adenopathy.  Neurological: She is alert and oriented to person, place, and time. No cranial nerve deficit. Coordination normal.  The patient is alert and right 3. Cranial nerves are intact. Sensation is intact in her bilateral upper and lower extremities.  Skin: Skin is warm and dry. No rash noted. She is not diaphoretic. No erythema. No pallor.  Psychiatric: She has a normal mood and affect. Her behavior is normal.  Nursing note and vitals reviewed.   ED Course  Procedures (including critical care time) Labs Review Labs Reviewed  BASIC METABOLIC PANEL - Abnormal; Notable for the following:    Creatinine, Ser 1.09 (*)    GFR calc non Af Amer 40 (*)    GFR calc Af Amer 47 (*)    All other components within normal limits  CBC WITH DIFFERENTIAL/PLATELET  PROTIME-INR  URINALYSIS, ROUTINE W REFLEX MICROSCOPIC (NOT AT [facility])  TYPE AND  SCREEN    Imaging Review Ct Head Wo Contrast  01/10/2015  CLINICAL DATA:  PT. SLID OFF BED AND HIT HEAD ON TABLE, LAC TO RIGHT OCCIPITAL REGION, LW EXAM: CT HEAD WITHOUT CONTRAST CT CERVICAL SPINE WITHOUT CONTRAST TECHNIQUE: Multidetector CT imaging of the head and cervical spine was performed following the standard protocol without intravenous contrast. Multiplanar CT image reconstructions of the cervical spine were also generated. COMPARISON:  11/12/2009 FINDINGS: CT HEAD FINDINGS There is significant central and cortical atrophy. Periventricular white matter changes are consistent with small vessel disease. Asymmetry of the lateral ventricles is stable. Bone windows show mucoperiosteal thickening in the right maxillary sinus. No acute calvarial injury. Mastoid air cells are normally aerated. There is  atherosclerotic calcification of the internal carotid arteries. CT CERVICAL SPINE FINDINGS There is significant degenerative change within the mid cervical spine, most notably at C5-6, C6-7. There is bilateral foraminal narrowing at C5-6 and C6-7. There is no acute fracture or subluxation. Lung apices are clear. There is significant atherosclerotic calcification of the aortic arch and branch vessels. Carotid arteries are densely calcified. IMPRESSION: 1. Atrophy and small vessel disease. 2.  No evidence for acute intracranial abnormality. 3. Mild chronic sinusitis. 4.  No evidence for acute cervical spine abnormality. Electronically Signed   By: Norva Pavlov M.D.   On: 01/10/2015 15:08   Ct Cervical Spine Wo Contrast  01/10/2015  CLINICAL DATA:  PT. SLID OFF BED AND HIT HEAD ON TABLE, LAC TO RIGHT OCCIPITAL REGION, LW EXAM: CT HEAD WITHOUT CONTRAST CT CERVICAL SPINE WITHOUT CONTRAST TECHNIQUE: Multidetector CT imaging of the head and cervical spine was performed following the standard protocol without intravenous contrast. Multiplanar CT image reconstructions of the cervical spine were also generated. COMPARISON:  11/12/2009 FINDINGS: CT HEAD FINDINGS There is significant central and cortical atrophy. Periventricular white matter changes are consistent with small vessel disease. Asymmetry of the lateral ventricles is stable. Bone windows show mucoperiosteal thickening in the right maxillary sinus. No acute calvarial injury. Mastoid air cells are normally aerated. There is atherosclerotic calcification of the internal carotid arteries. CT CERVICAL SPINE FINDINGS There is significant degenerative change within the mid cervical spine, most notably at C5-6, C6-7. There is bilateral foraminal narrowing at C5-6 and C6-7. There is no acute fracture or subluxation. Lung apices are clear. There is significant atherosclerotic calcification of the aortic arch and branch vessels. Carotid arteries are densely calcified.  IMPRESSION: 1. Atrophy and small vessel disease. 2.  No evidence for acute intracranial abnormality. 3. Mild chronic sinusitis. 4.  No evidence for acute cervical spine abnormality. Electronically Signed   By: Norva Pavlov M.D.   On: 01/10/2015 15:08   Dg Hip Unilat With Pelvis 2-3 Views Right  01/10/2015  CLINICAL DATA:  Fall last night from pad with right lower extremity pain. Initial encounter. EXAM: DG HIP (WITH OR WITHOUT PELVIS) 2-3V RIGHT COMPARISON:  None. FINDINGS: No acute fracture or dislocation is identified. A bipolar hemiarthroplasty shows normal alignment. The bony pelvis shows osteopenia. Vascular calcifications are present. Soft tissues are unremarkable. IMPRESSION: No acute fracture identified. A bipolar arthroplasty shows normal alignment. Electronically Signed   By: Irish Lack M.D.   On: 01/10/2015 14:35   Dg Femur, Min 2 Views Right  01/10/2015  CLINICAL DATA:  Fall last night; pt stated she slid from bed and landed right side as she was putting on pajamas. Insists she did not fall but slid. Pain midshaft right femur Hx prior right surgery 2012, cannot recall  date of left hip surgery states "years and years" ago EXAM: RIGHT FEMUR 2 VIEWS COMPARISON:  01/10/2015 hip FINDINGS: Patient has had previous right hip arthroplasty. The femur is intact. Bones appear radiolucent. There is dense atherosclerotic calcification of the right femoral and popliteal artery. Degenerative changes are seen at the knee. Evaluation of the knee is degraded by technique. Dedicated knee views are recommended if needed. IMPRESSION: No evidence for acute fracture of the femur. Electronically Signed   By: Norva Pavlov M.D.   On: 01/10/2015 14:33   I have personally reviewed and evaluated these images and lab results as part of my medical decision-making.   EKG Interpretation   Date/Time:  Saturday January 10 2015 13:52:46 EST Ventricular Rate:  53 PR Interval:  266 QRS Duration: 146 QT  Interval:  488 QTC Calculation: 458 R Axis:   -76 Text Interpretation:  Sinus rhythm Atrial premature complexes Prolonged PR  interval RBBB and LAFB Left ventricular hypertrophy Anterior Q waves,  possibly due to LVH Confirmed by RAY MD, DANIELLE [phone]) on 01/10/2015  5:02:29 PM      Filed Vitals:   01/10/15 1300 01/10/15 1322 01/10/15 1330 01/10/15 1335  BP: 172/91 172/91 147/69   Pulse:  65 73 121  Temp:      TempSrc:      Resp:  18    Height:      Weight:      SpO2:  94% 88% 98%     MDM   Meds given in ED:  Medications  Tdap (BOOSTRIX) injection 0.5 mL (not administered)    New Prescriptions   No medications on file    Final diagnoses:  Right hip pain  Laceration of head, initial encounter  Fall, initial encounter   This is a 81 y.o. female with a history of right hip replacement, and anxiety who presents to the emergency department after she slid off of her bed last night complaining of right hip pain. Patient reports she slid off her bed and into an oak dresser around 8:30 PM last night. She reports she "scooted off the bed" and did not actually fall. She reports she hit the back of her head but denies any headache or injury there. She reports she did not know she hit her head until she had blood from the site. She reports having right hip pain this morning when trying to get up to go to bathroom and was able to stand on her right leg. She reports now she has no pain lying in the bed. On exam patient is afebrile nontoxic appearing. She has no focal neurological deficits. She is alert and oriented 3. She is very hard of hearing. She does have a small 2 cm laceration to her right posterior head that is well approximated. Is this is, spin 20 hours since the fall I will not repair as it is so well approximated and in her hairline. The wound was thoroughly cleaned and dressed. Patient complains of pain to her right lateral hip and thigh. She has no pain with manipulation of her  right lower leg. Patient has had a previous right hip arthroplasty by Dr. Dorene Grebe from [facility]. CBC is within normal limits. BMP indicates a CR of 1.09. INR is 1.06. CT head and cervical spine showed no acute abnormalities. Right hip with pelvis and right femur show no evidence of acute fracture or dislocation. We attempted to ambulate the patient, however the patient was unable to ambulate per her  norm and complained of lots of pain at her right hip. Will obtain CT of her right hip for further evaluation. At shift change patient is awaiting CT of her right hip. Patient care handed off to [facility], PA-C at shift change who will disposition the patient.   This patient was discussed with and evaluated by Dr. Clarene DukeLittle who agrees with assessment and plan.  Everlene FarrierWilliam Iolanda Folson, PA-C 01/10/15 1716  Laurence Spatesachel Morgan Little, MD 01/11/15 [phone]

## 2015-01-10 NOTE — ED Notes (Signed)
Pt presents via [facility] from home with son for right hip pain. Pt slid off of bed last night when changing clothes and hit her head on dresser also hurting her right hip.  Witnessed fall, -LOC, son helped pt back into bed.  Small lac to right head, bleeding controlled.  Pt hx of right hip replacement, now having trouble walking d/t right hip pain.  No deformites or crepitus, no shortening of leg of rotation noted.  +CNS, increased pain with palpation.  Denies neck or back pain.  BP-134/70 P-70 R-16 O2-100% RA.  A x 4, NAD. Family at bedside.

## 2015-01-10 NOTE — H&P (Signed)
[facility] Admission History and Physical       Veronica [name] ZOX:[id number] DOB: [birth year] DOA: 01/10/2015  Referring physician: EDP PCP: Kirt Boys, DO  Specialists:   Chief Complaint: Right Hip Pain  HPI: Veronica [name] is a 81 y.o. female who suffered a fall when she slid off her bed while getting dressed for bed last night. Her fall was witnessed and she suffered right sided pain and a scalp laceration.    In the AM she could not bear weight on her right leg and she was brought to the ED.  CT scan of the Hip revealed a small intertrochanteric Fracture of the Right Hip.      Review of Systems:  Constitutional: No Weight Loss, No Weight Gain, Night Sweats, Fevers, Chills, Dizziness, Light Headedness, Fatigue, or Generalized Weakness HEENT: No Headaches, Difficulty Swallowing,Tooth/Dental Problems,Sore Throat,  No Sneezing, Rhinitis, Ear Ache, Nasal Congestion, or Post Nasal Drip,  Cardio-vascular:  No Chest pain, Orthopnea, PND, Edema in Lower Extremities, Anasarca, Dizziness, Palpitations  Resp: No Dyspnea, No DOE, No Productive Cough, No Non-Productive Cough, No Hemoptysis, No Wheezing.    GI: No Heartburn, Indigestion, Abdominal Pain, Nausea, Vomiting, Diarrhea, Constipation, Hematemesis, Hematochezia, Melena, Change in Bowel Habits,  Loss of Appetite  GU: No Dysuria, No Change in Color of Urine, No Urgency or Urinary Frequency, No Flank pain.  Musculoskeletal:+Right Hip Pain, No Decreased Range of Motion, No Back Pain.  Neurologic: No Syncope, No Seizures, Muscle Weakness, Paresthesia, Vision Disturbance or Loss, No Diplopia, No Vertigo, No Difficulty Walking,  Skin: No Rash or Lesions. Psych: No Change in Mood or Affect, No Depression or Anxiety, No Memory loss, No Confusion, or Hallucinations   Past Medical History  Diagnosis Date  . Hx of recurrent vertebral fractures   . Anxiety   . History of shingles     severe; abdominal     Past Surgical  History  Procedure Laterality Date  . Eye surgery      cataracts  . Fracture surgery Right 2012    Scott Dean; hip repair  . Fracture surgery Right 2011    shoulder shattered due to fall      Prior to Admission medications   Medication Sig Start Date End Date Taking? Authorizing Provider  celecoxib (CELEBREX) 100 MG capsule Take 1 capsule (100 mg total) by mouth 2 (two) times daily. 10/22/14  Yes Monica Carter, DO  escitalopram (LEXAPRO) 10 MG tablet Take 1 tablet (10 mg total) by mouth daily. 10/22/14  Yes Kirt Boys, DO  hydrochlorothiazide (MICROZIDE) 12.5 MG capsule Take 12.5 mg by mouth daily.   Yes Historical Provider, MD  mirtazapine (REMERON) 15 MG tablet Take 1 tablet (15 mg total) by mouth at bedtime. 06/18/14  Yes Kirt Boys, DO  Multiple Vitamins-Minerals ([employer] SPECTRAVITE ADULT 50+) TABS Take 1 tablet by mouth daily.   Yes Historical Provider, MD  solifenacin (VESICARE) 5 MG tablet Take 1 tablet (5 mg total) by mouth daily. 06/18/14  Yes Kirt Boys, DO  Tdap (BOOSTRIX) 5-2.5-18.5 LF-MCG/0.5 injection Inject 0.5 mLs into the muscle once. 08/22/14  Yes Kirt Boys, DO  zoster vaccine live, PF, (ZOSTAVAX) 81191 UNT/0.65ML injection Inject 19,400 Units into the skin once. 08/22/14  Yes Kirt Boys, DO     No Known Allergies    Social History:  Lives with Son, Previously Walked with cane, Unable to use Walker due to Chronic Rt Shoulder Fx  reports that she has never smoked. She has never used smokeless tobacco.  She reports that she does not drink alcohol or use illicit drugs.     Family History  Problem Relation Age of Onset  . Stroke Mother   . Stroke Father   . COPD Sister        Physical Exam:  GEN:  Pleasant Thin Elderly  81 y.o. Caucasian female examined and in Discomfort but No acute distress; cooperative with exam Filed Vitals:   01/10/15 1300 01/10/15 1322 01/10/15 1330 01/10/15 1335  BP: 172/91 172/91 147/69   Pulse:  65 73 121  Temp:        TempSrc:      Resp:  18    Height:      Weight:      SpO2:  94% 88% 98%   Blood pressure 147/69, pulse 121, temperature 97.9 F (36.6 C), temperature source Oral, resp. rate 18, height 5\' 4"  (1.626 m), weight 49.896 kg (110 lb), SpO2 98 %. PSYCH: She is alert and oriented x4; does not appear anxious does not appear depressed; affect is normal HEENT: Normocephalic and Atraumatic, Mucous membranes pink; PERRLA; EOM intact; Fundi:  Benign;  No scleral icterus, Nares: Patent, Oropharynx: Clear, Edentulous with Dentures Present,    Neck:  FROM, No Cervical Lymphadenopathy nor Thyromegaly or Carotid Bruit; No JVD; Breasts:: Not examined CHEST WALL: No tenderness CHEST: Normal respiration, clear to auscultation bilaterally HEART: Regular rate and rhythm; no murmurs rubs or gallops BACK: No kyphosis or scoliosis; No CVA tenderness ABDOMEN: Positive Bowel Sounds, Scaphoid, Soft Non-Tender, No Rebound or Guarding; No Masses, No Organomegaly. Rectal Exam: Not done EXTREMITIES: No Cyanosis, Clubbing, or Edema; No Ulcerations. Genitalia: not examined PULSES: 2+ and symmetric SKIN: Normal hydration no rash or ulceration CNS:  Alert and Oriented x 4, No Focal Deficits except for Hearing Loss Vascular: pulses palpable throughout    Labs on Admission:  Basic Metabolic Panel:  Recent Labs Lab 01/10/15 1351  NA 138  K 3.9  CL 101  CO2 27  GLUCOSE 99  BUN 12  CREATININE 1.09*  CALCIUM 10.1   Liver Function Tests: No results for input(s): AST, ALT, ALKPHOS, BILITOT, PROT, ALBUMIN in the last 168 hours. No results for input(s): LIPASE, AMYLASE in the last 168 hours. No results for input(s): AMMONIA in the last 168 hours. CBC:  Recent Labs Lab 01/10/15 1351  WBC 7.6  NEUTROABS 5.4  HGB 14.5  HCT 43.7  MCV 92.2  PLT 351   Cardiac Enzymes: No results for input(s): CKTOTAL, CKMB, CKMBINDEX, TROPONINI in the last 168 hours.  BNP (last 3 results) No results for input(s): BNP in the  last 8760 hours.  ProBNP (last 3 results) No results for input(s): PROBNP in the last 8760 hours.  CBG: No results for input(s): GLUCAP in the last 168 hours.  Radiological Exams on Admission: Ct Head Wo Contrast  01/10/2015  CLINICAL DATA:  PT. SLID OFF BED AND HIT HEAD ON TABLE, LAC TO RIGHT OCCIPITAL REGION, LW EXAM: CT HEAD WITHOUT CONTRAST CT CERVICAL SPINE WITHOUT CONTRAST TECHNIQUE: Multidetector CT imaging of the head and cervical spine was performed following the standard protocol without intravenous contrast. Multiplanar CT image reconstructions of the cervical spine were also generated. COMPARISON:  11/12/2009 FINDINGS: CT HEAD FINDINGS There is significant central and cortical atrophy. Periventricular white matter changes are consistent with small vessel disease. Asymmetry of the lateral ventricles is stable. Bone windows show mucoperiosteal thickening in the right maxillary sinus. No acute calvarial injury. Mastoid air cells are normally aerated. There  is atherosclerotic calcification of the internal carotid arteries. CT CERVICAL SPINE FINDINGS There is significant degenerative change within the mid cervical spine, most notably at C5-6, C6-7. There is bilateral foraminal narrowing at C5-6 and C6-7. There is no acute fracture or subluxation. Lung apices are clear. There is significant atherosclerotic calcification of the aortic arch and branch vessels. Carotid arteries are densely calcified. IMPRESSION: 1. Atrophy and small vessel disease. 2.  No evidence for acute intracranial abnormality. 3. Mild chronic sinusitis. 4.  No evidence for acute cervical spine abnormality. Electronically Signed   By: Norva Pavlov M.D.   On: 01/10/2015 15:08   Ct Cervical Spine Wo Contrast  01/10/2015  CLINICAL DATA:  PT. SLID OFF BED AND HIT HEAD ON TABLE, LAC TO RIGHT OCCIPITAL REGION, LW EXAM: CT HEAD WITHOUT CONTRAST CT CERVICAL SPINE WITHOUT CONTRAST TECHNIQUE: Multidetector CT imaging of the head and  cervical spine was performed following the standard protocol without intravenous contrast. Multiplanar CT image reconstructions of the cervical spine were also generated. COMPARISON:  11/12/2009 FINDINGS: CT HEAD FINDINGS There is significant central and cortical atrophy. Periventricular white matter changes are consistent with small vessel disease. Asymmetry of the lateral ventricles is stable. Bone windows show mucoperiosteal thickening in the right maxillary sinus. No acute calvarial injury. Mastoid air cells are normally aerated. There is atherosclerotic calcification of the internal carotid arteries. CT CERVICAL SPINE FINDINGS There is significant degenerative change within the mid cervical spine, most notably at C5-6, C6-7. There is bilateral foraminal narrowing at C5-6 and C6-7. There is no acute fracture or subluxation. Lung apices are clear. There is significant atherosclerotic calcification of the aortic arch and branch vessels. Carotid arteries are densely calcified. IMPRESSION: 1. Atrophy and small vessel disease. 2.  No evidence for acute intracranial abnormality. 3. Mild chronic sinusitis. 4.  No evidence for acute cervical spine abnormality. Electronically Signed   By: Norva Pavlov M.D.   On: 01/10/2015 15:08   Ct Hip Right Wo Contrast  01/10/2015  CLINICAL DATA:  Right hip pain after fall. EXAM: CT OF THE RIGHT HIP WITHOUT CONTRAST TECHNIQUE: Multidetector CT imaging of the right hip was performed according to the standard protocol. Multiplanar CT image reconstructions were also generated. COMPARISON:  Radiographs from 01/10/2015 FINDINGS: A right hip prosthesis is in place. Streak artifact from the prosthesis mildly obscures surrounding bony structures. There new bony deformities in the right superior and inferior pubic ramus, but these for the most part seem to represent mature callus rather than acute bony discontinuity. They resemble healed fractures rather than acute fractures. These  were not present on 11/10/2009. There is some increased irregularity along the greater trochanter cortical margin, for example on image 25 series 6. This is new from 11/10/2009 but also appears to be new bilaterally since that time, raising the likelihood that this is simply degenerative spurring rather than a sign of a fracture. However, on consecutive images 16 through 22 of series 8 there is slight cortical step-off tracking anteriorly along the right proximal femoral metaphysis suspicious for a small periprosthetic fracture. This is nondisplaced and highly subtle. There is a small chance that this could represent spurring but the location seems atypical. Iliac atherosclerosis noted. IMPRESSION: 1. Suspicion for a small nondisplaced periprosthetic fracture anteriorly along the right femur subtrochanteric region. Admittedly this is a subtle finding and could conceivably be due to spurring. Reference images 16-22 of series 8. 2. Bony deformities in the right pubic rami are new from 2011 but seen to represent  mature callus compatible with old pubic ramus fractures. 3. Streak artifact from the implant reduces diagnostic sensitivity/specificity. 4. Iliac atherosclerosis. Electronically Signed   By: Gaylyn Rong M.D.   On: 01/10/2015 18:18   Dg Hip Unilat With Pelvis 2-3 Views Right  01/10/2015  CLINICAL DATA:  Fall last night from pad with right lower extremity pain. Initial encounter. EXAM: DG HIP (WITH OR WITHOUT PELVIS) 2-3V RIGHT COMPARISON:  None. FINDINGS: No acute fracture or dislocation is identified. A bipolar hemiarthroplasty shows normal alignment. The bony pelvis shows osteopenia. Vascular calcifications are present. Soft tissues are unremarkable. IMPRESSION: No acute fracture identified. A bipolar arthroplasty shows normal alignment. Electronically Signed   By: Irish Lack M.D.   On: 01/10/2015 14:35   Dg Femur, Min 2 Views Right  01/10/2015  CLINICAL DATA:  Fall last night; pt stated  she slid from bed and landed right side as she was putting on pajamas. Insists she did not fall but slid. Pain midshaft right femur Hx prior right surgery 2012, cannot recall date of left hip surgery states "years and years" ago EXAM: RIGHT FEMUR 2 VIEWS COMPARISON:  01/10/2015 hip FINDINGS: Patient has had previous right hip arthroplasty. The femur is intact. Bones appear radiolucent. There is dense atherosclerotic calcification of the right femoral and popliteal artery. Degenerative changes are seen at the knee. Evaluation of the knee is degraded by technique. Dedicated knee views are recommended if needed. IMPRESSION: No evidence for acute fracture of the femur. Electronically Signed   By: Norva Pavlov M.D.   On: 01/10/2015 14:33     EKG: Independently reviewed. Sinus Bradycardia  At 53, +RBBB, +LVH    Assessment/Plan:   81 y.o. female with  Principal Problem:   1.     Hip fracture (HCC)   Ortho to see Dr. Consuello Bossier Al in AM    Per Review of Films by Dr Magnus Ivan: Likely Non-Surgical   Pain control PRN   Bedrest   Active Problems:   2.     Essential hypertension   Continue HCTZ   Monitor BPs     3.     OAB (overactive bladder)   On Vesicare Rx     4.     Depression with anxiety   Continue Lexapro      5.     Hearing loss- due due H.Zoster in Past   Hearing Aids at home and Need Batteries     6.     Scalp laceration- No Sutures needed   Neosporin BID     7.     Fall- Mechanical in [employer] Precautions     8.     History of right shoulder fracture   Pain Control PRN     9.     DVT Prophylaxis   SCDs    Code Status:    DO NOT RESUSCITATE (DNR)      Family Communication:   Son at Bedside   Disposition Plan:    Inpatient Status        Time spent:  16 Minutes      Ron Parker [facility] Pager [phone]   If 7AM -7PM Please Contact the Day Rounding Team MD for [facility]  If 7PM-7AM, Please Contact Night-Floor Coverage    [email or web address] Password [facility] 01/10/2015, 8:24 PM     ADDENDUM:   Patient was seen and examined on 01/10/2015

## 2015-01-10 NOTE — Progress Notes (Signed)
Patient ID: Veronica [name], female   DOB: Mar 01, 1912, 81 y.o.   MRN: [id number] I have reviewed her plain films and CT scan of her right hip.  There does appear to be a small non-displaced fracture line in the proximal femur.  Her hip prosthesis appears to be well-seated.  The fracture is in the intertrochanteric region.  She needs to remain non-weight bearing on her right hip for likely the next 4-6 weeks to allow for healing of the fracture.  Likely no further surgery is needed.  I will see her during this hospitalization.

## 2015-01-11 DIAGNOSIS — I10 Essential (primary) hypertension: Secondary | ICD-10-CM | POA: Diagnosis not present

## 2015-01-11 DIAGNOSIS — W19XXXD Unspecified fall, subsequent encounter: Secondary | ICD-10-CM | POA: Diagnosis not present

## 2015-01-11 DIAGNOSIS — F418 Other specified anxiety disorders: Secondary | ICD-10-CM

## 2015-01-11 DIAGNOSIS — Z8781 Personal history of (healed) traumatic fracture: Secondary | ICD-10-CM

## 2015-01-11 DIAGNOSIS — S72001A Fracture of unspecified part of neck of right femur, initial encounter for closed fracture: Secondary | ICD-10-CM | POA: Diagnosis not present

## 2015-01-11 DIAGNOSIS — S0101XA Laceration without foreign body of scalp, initial encounter: Secondary | ICD-10-CM

## 2015-01-11 DIAGNOSIS — N3281 Overactive bladder: Secondary | ICD-10-CM

## 2015-01-11 DIAGNOSIS — H9190 Unspecified hearing loss, unspecified ear: Secondary | ICD-10-CM

## 2015-01-11 LAB — BASIC METABOLIC PANEL
ANION GAP: 7 (ref 5–15)
BUN: 12 mg/dL (ref 6–20)
CHLORIDE: 102 mmol/L (ref 101–111)
CO2: 29 mmol/L (ref 22–32)
CREATININE: 1.08 mg/dL — AB (ref 0.44–1.00)
Calcium: 9.3 mg/dL (ref 8.9–10.3)
GFR calc non Af Amer: 41 mL/min — ABNORMAL LOW (ref >60)
GFR, EST AFRICAN AMERICAN: 47 mL/min — AB (ref >60)
Glucose, Bld: 83 mg/dL (ref 65–99)
POTASSIUM: 3.3 mmol/L — AB (ref 3.5–5.1)
SODIUM: 138 mmol/L (ref 135–145)

## 2015-01-11 LAB — CBC
HCT: 38.6 % (ref 36.0–46.0)
Hemoglobin: 12.8 g/dL (ref 12.0–15.0)
MCH: 30.6 pg (ref 26.0–34.0)
MCHC: 33.2 g/dL (ref 30.0–36.0)
MCV: 92.3 fL (ref 78.0–100.0)
PLATELETS: 339 10*3/uL (ref 150–400)
RBC: 4.18 MIL/uL (ref 3.87–5.11)
RDW: 13.2 % (ref 11.5–15.5)
WBC: 7.2 10*3/uL (ref 4.0–10.5)

## 2015-01-11 MED ORDER — POTASSIUM CHLORIDE CRYS ER 20 MEQ PO TBCR
40.0000 meq | EXTENDED_RELEASE_TABLET | Freq: Once | ORAL | Status: AC
  Administered 2015-01-11: 40 meq via ORAL
  Filled 2015-01-11: qty 2

## 2015-01-11 NOTE — Consult Note (Signed)
Reason for Consult:  Right hip pain, known fracture Referring Physician: EDP Kandice Robinsons, MD)  Veronica [name] is an 81 y.o. female.  HPI:   81 yo female with acute right hip pain after a mechanical fall.  With the inability to ambulate and significant pain, was brought to the [facility] ED and found to have a right proximal femur fracture (small fracture line seen only on CT scan) around a previous right hip hemiarthroplasty.  Was admitted to pain control and mobility with the likely need for SNF placement.    Past Medical History  Diagnosis Date  . Hx of recurrent vertebral fractures   . Anxiety   . History of shingles     severe; abdominal    Past Surgical History  Procedure Laterality Date  . Eye surgery      cataracts  . Fracture surgery Right 2012    Scott Dean; hip repair  . Fracture surgery Right 2011    shoulder shattered due to fall    Family History  Problem Relation Age of Onset  . Stroke Mother   . Stroke Father   . COPD Sister     Social History:  reports that she has never smoked. She has never used smokeless tobacco. She reports that she does not drink alcohol or use illicit drugs.  Allergies: No Known Allergies  Medications: I have reviewed the patient's current medications.  Results for orders placed or performed during the hospital encounter of 01/10/15 (from the past 48 hour(s))  Type and screen [place]     Status: None   Collection Time: 01/10/15  1:22 PM  Result Value Ref Range   ABO/RH(D) B POS    Antibody Screen NEG    Sample Expiration 13/24/4010   Basic metabolic panel     Status: Abnormal   Collection Time: 01/10/15  1:51 PM  Result Value Ref Range   Sodium 138 135 - 145 mmol/L   Potassium 3.9 3.5 - 5.1 mmol/L   Chloride 101 101 - 111 mmol/L   CO2 27 22 - 32 mmol/L   Glucose, Bld 99 65 - 99 mg/dL   BUN 12 6 - 20 mg/dL   Creatinine, Ser 1.09 (H) 0.44 - 1.00 mg/dL   Calcium 10.1 8.9 - 10.3 mg/dL   GFR calc non Af Amer  40 (L) >60 mL/min   GFR calc Af Amer 47 (L) >60 mL/min    Comment: (NOTE) The eGFR has been calculated using the CKD EPI equation. This calculation has not been validated in all clinical situations. eGFR's persistently <60 mL/min signify possible Chronic Kidney Disease.    Anion gap 10 5 - 15  CBC WITH DIFFERENTIAL     Status: None   Collection Time: 01/10/15  1:51 PM  Result Value Ref Range   WBC 7.6 4.0 - 10.5 K/uL   RBC 4.74 3.87 - 5.11 MIL/uL   Hemoglobin 14.5 12.0 - 15.0 g/dL   HCT 43.7 36.0 - 46.0 %   MCV 92.2 78.0 - 100.0 fL   MCH 30.6 26.0 - 34.0 pg   MCHC 33.2 30.0 - 36.0 g/dL   RDW 13.2 11.5 - 15.5 %   Platelets 351 150 - 400 K/uL   Neutrophils Relative % 72 %   Neutro Abs 5.4 1.7 - 7.7 K/uL   Lymphocytes Relative 16 %   Lymphs Abs 1.2 0.7 - 4.0 K/uL   Monocytes Relative 9 %   Monocytes Absolute 0.7 0.1 - 1.0  K/uL   Eosinophils Relative 3 %   Eosinophils Absolute 0.3 0.0 - 0.7 K/uL   Basophils Relative 0 %   Basophils Absolute 0.0 0.0 - 0.1 K/uL  Protime-INR     Status: None   Collection Time: 01/10/15  1:51 PM  Result Value Ref Range   Prothrombin Time 14.0 11.6 - 15.2 seconds   INR 1.06 0.00 - 6.38  Basic metabolic panel     Status: Abnormal   Collection Time: 01/11/15  2:59 AM  Result Value Ref Range   Sodium 138 135 - 145 mmol/L   Potassium 3.3 (L) 3.5 - 5.1 mmol/L   Chloride 102 101 - 111 mmol/L   CO2 29 22 - 32 mmol/L   Glucose, Bld 83 65 - 99 mg/dL   BUN 12 6 - 20 mg/dL   Creatinine, Ser 1.08 (H) 0.44 - 1.00 mg/dL   Calcium 9.3 8.9 - 10.3 mg/dL   GFR calc non Af Amer 41 (L) >60 mL/min   GFR calc Af Amer 47 (L) >60 mL/min    Comment: (NOTE) The eGFR has been calculated using the CKD EPI equation. This calculation has not been validated in all clinical situations. eGFR's persistently <60 mL/min signify possible Chronic Kidney Disease.    Anion gap 7 5 - 15  CBC     Status: None   Collection Time: 01/11/15  2:59 AM  Result Value Ref Range   WBC  7.2 4.0 - 10.5 K/uL   RBC 4.18 3.87 - 5.11 MIL/uL   Hemoglobin 12.8 12.0 - 15.0 g/dL   HCT 38.6 36.0 - 46.0 %   MCV 92.3 78.0 - 100.0 fL   MCH 30.6 26.0 - 34.0 pg   MCHC 33.2 30.0 - 36.0 g/dL   RDW 13.2 11.5 - 15.5 %   Platelets 339 150 - 400 K/uL    Ct Head Wo Contrast  01/10/2015  CLINICAL DATA:  PT. SLID OFF BED AND HIT HEAD ON TABLE, LAC TO RIGHT OCCIPITAL REGION, LW EXAM: CT HEAD WITHOUT CONTRAST CT CERVICAL SPINE WITHOUT CONTRAST TECHNIQUE: Multidetector CT imaging of the head and cervical spine was performed following the standard protocol without intravenous contrast. Multiplanar CT image reconstructions of the cervical spine were also generated. COMPARISON:  11/12/2009 FINDINGS: CT HEAD FINDINGS There is significant central and cortical atrophy. Periventricular white matter changes are consistent with small vessel disease. Asymmetry of the lateral ventricles is stable. Bone windows show mucoperiosteal thickening in the right maxillary sinus. No acute calvarial injury. Mastoid air cells are normally aerated. There is atherosclerotic calcification of the internal carotid arteries. CT CERVICAL SPINE FINDINGS There is significant degenerative change within the mid cervical spine, most notably at C5-6, C6-7. There is bilateral foraminal narrowing at C5-6 and C6-7. There is no acute fracture or subluxation. Lung apices are clear. There is significant atherosclerotic calcification of the aortic arch and branch vessels. Carotid arteries are densely calcified. IMPRESSION: 1. Atrophy and small vessel disease. 2.  No evidence for acute intracranial abnormality. 3. Mild chronic sinusitis. 4.  No evidence for acute cervical spine abnormality. Electronically Signed   By: Nolon Nations M.D.   On: 01/10/2015 15:08   Ct Cervical Spine Wo Contrast  01/10/2015  CLINICAL DATA:  PT. SLID OFF BED AND HIT HEAD ON TABLE, LAC TO RIGHT OCCIPITAL REGION, LW EXAM: CT HEAD WITHOUT CONTRAST CT CERVICAL SPINE WITHOUT  CONTRAST TECHNIQUE: Multidetector CT imaging of the head and cervical spine was performed following the standard protocol without intravenous  contrast. Multiplanar CT image reconstructions of the cervical spine were also generated. COMPARISON:  11/12/2009 FINDINGS: CT HEAD FINDINGS There is significant central and cortical atrophy. Periventricular white matter changes are consistent with small vessel disease. Asymmetry of the lateral ventricles is stable. Bone windows show mucoperiosteal thickening in the right maxillary sinus. No acute calvarial injury. Mastoid air cells are normally aerated. There is atherosclerotic calcification of the internal carotid arteries. CT CERVICAL SPINE FINDINGS There is significant degenerative change within the mid cervical spine, most notably at C5-6, C6-7. There is bilateral foraminal narrowing at C5-6 and C6-7. There is no acute fracture or subluxation. Lung apices are clear. There is significant atherosclerotic calcification of the aortic arch and branch vessels. Carotid arteries are densely calcified. IMPRESSION: 1. Atrophy and small vessel disease. 2.  No evidence for acute intracranial abnormality. 3. Mild chronic sinusitis. 4.  No evidence for acute cervical spine abnormality. Electronically Signed   By: Nolon Nations M.D.   On: 01/10/2015 15:08   Ct Hip Right Wo Contrast  01/10/2015  CLINICAL DATA:  Right hip pain after fall. EXAM: CT OF THE RIGHT HIP WITHOUT CONTRAST TECHNIQUE: Multidetector CT imaging of the right hip was performed according to the standard protocol. Multiplanar CT image reconstructions were also generated. COMPARISON:  Radiographs from 01/10/2015 FINDINGS: A right hip prosthesis is in place. Streak artifact from the prosthesis mildly obscures surrounding bony structures. There new bony deformities in the right superior and inferior pubic ramus, but these for the most part seem to represent mature callus rather than acute bony discontinuity. They  resemble healed fractures rather than acute fractures. These were not present on 11/10/2009. There is some increased irregularity along the greater trochanter cortical margin, for example on image 25 series 6. This is new from 11/10/2009 but also appears to be new bilaterally since that time, raising the likelihood that this is simply degenerative spurring rather than a sign of a fracture. However, on consecutive images 16 through 22 of series 8 there is slight cortical step-off tracking anteriorly along the right proximal femoral metaphysis suspicious for a small periprosthetic fracture. This is nondisplaced and highly subtle. There is a small chance that this could represent spurring but the location seems atypical. Iliac atherosclerosis noted. IMPRESSION: 1. Suspicion for a small nondisplaced periprosthetic fracture anteriorly along the right femur subtrochanteric region. Admittedly this is a subtle finding and could conceivably be due to spurring. Reference images 16-22 of series 8. 2. Bony deformities in the right pubic rami are new from 2011 but seen to represent mature callus compatible with old pubic ramus fractures. 3. Streak artifact from the implant reduces diagnostic sensitivity/specificity. 4. Iliac atherosclerosis. Electronically Signed   By: Van Clines M.D.   On: 01/10/2015 18:18   Dg Hip Unilat With Pelvis 2-3 Views Right  01/10/2015  CLINICAL DATA:  Fall last night from pad with right lower extremity pain. Initial encounter. EXAM: DG HIP (WITH OR WITHOUT PELVIS) 2-3V RIGHT COMPARISON:  None. FINDINGS: No acute fracture or dislocation is identified. A bipolar hemiarthroplasty shows normal alignment. The bony pelvis shows osteopenia. Vascular calcifications are present. Soft tissues are unremarkable. IMPRESSION: No acute fracture identified. A bipolar arthroplasty shows normal alignment. Electronically Signed   By: Aletta Edouard M.D.   On: 01/10/2015 14:35   Dg Femur, Min 2 Views  Right  01/10/2015  CLINICAL DATA:  Fall last night; pt stated she slid from bed and landed right side as she was putting on pajamas. Insists she did  not fall but slid. Pain midshaft right femur Hx prior right surgery 2012, cannot recall date of left hip surgery states "years and years" ago EXAM: RIGHT FEMUR 2 VIEWS COMPARISON:  01/10/2015 hip FINDINGS: Patient has had previous right hip arthroplasty. The femur is intact. Bones appear radiolucent. There is dense atherosclerotic calcification of the right femoral and popliteal artery. Degenerative changes are seen at the knee. Evaluation of the knee is degraded by technique. Dedicated knee views are recommended if needed. IMPRESSION: No evidence for acute fracture of the femur. Electronically Signed   By: Nolon Nations M.D.   On: 01/10/2015 14:33    ROS Blood pressure 135/77, pulse 71, temperature 97.8 F (36.6 C), temperature source Oral, resp. rate 16, height 5' 4" (1.626 m), weight 49.896 kg (110 lb), SpO2 99 %. Physical Exam  Constitutional: She appears well-developed and well-nourished.  HENT:  Head: Normocephalic.  Right Ear: Decreased hearing is noted.  Left Ear: Decreased hearing is noted.  Eyes: Pupils are equal, round, and reactive to light.  Neck: Normal range of motion. Neck supple.  Cardiovascular: Normal rate.   Respiratory: Effort normal and breath sounds normal.  GI: Soft.  Musculoskeletal:       Right hip: She exhibits decreased strength and bony tenderness.  Neurological: She is alert.  Skin: Skin is warm and dry.  Psychiatric: She has a normal mood and affect.   Her right hip pain is very mild.  She tolerates me moving it easily.   Assessment/Plan: Right hip with non-displaced proximal femur fracture around a previous right hip prosthesis. 1)  This is a fracture that can be treated non-operatively and with time.  He implant appears to be well-seated without complicating features.  Will need to limit weightbearing on  her right hip for the next 4-6 weeks to allow adequate healing to then support her weight.  Will likely need SNF placement.  BLACKMAN,CHRISTOPHER Y 01/11/2015, 11:25 AM

## 2015-01-11 NOTE — Progress Notes (Signed)
[facility]                                                                              Patient Demographics  Veronica [name], is a 81 y.o. female, DOB - [birth year], ZOX:[id number]  Admit date - 01/10/2015   Admitting Physician Ron Parker, MD  Outpatient Primary MD for the patient is Kirt Boys, DO  LOS - 1   Chief Complaint  Patient presents with  . Hip Pain       Brief HPI   Veronica [name] is a 81 y.o. female who suffered a fall when she slid off her bed while getting dressed for bed last night. Her fall was witnessed and she suffered right sided pain and a scalp laceration. In the AM she could not bear weight on her right leg and she was brought to the ED. CT scan of the Hip revealed a small intertrochanteric Fracture of the Right Hip   Assessment & Plan    Principal Problem:   Right Hip fracture [facility]) - Orthopedics consulted, patient seen by Dr. Ophelia Charter, per recommendations appears to be small nondisplaced fracture line in the proximal femur, hip prosthesis well-seated. Recommended he remain nonweightbearing on right hip for next 4-6 weeks to allow for healing of the fracture, likely no further surgeries needed.  Active Problems:   Depression with anxiety - Currently stable, continue Lexapro    OAB (overactive bladder) - Continue Vesicare    Hearing loss    Essential hypertension -Currently stable, continue HCTZ  Hypokalemia - Replaced     Scalp laceration, fall - No sutures were needed, Neosporin    History of right shoulder fracture - Pain control as needed   Code Status: DO NOT RESUSCITATE  Family Communication: Discussed in detail with the patient, all imaging results, lab results explained to the patient    Disposition Plan: Will need skilled nursing facility   Time Spent in minutes   25 minutes  Procedures  None   Consults   ortho  DVT Prophylaxis  SCD's  Medications  Scheduled Meds: . darifenacin   7.5 mg Oral Daily  . escitalopram  10 mg Oral Daily  . hydrochlorothiazide  12.5 mg Oral Daily  . mirtazapine  15 mg Oral QHS  . multivitamin with minerals  1 tablet Oral Daily  . sodium chloride  3 mL Intravenous Q12H  . Tdap  0.5 mL Intramuscular Once   Continuous Infusions:  PRN Meds:.sodium chloride, acetaminophen **OR** acetaminophen, alum & mag hydroxide-simeth, hydrALAZINE, HYDROmorphone (DILAUDID) injection, ondansetron **OR** ondansetron (ZOFRAN) IV, oxyCODONE, sodium chloride   Antibiotics   Anti-infectives    None        Subjective:   Anishka Bushard was seen and examined today. Hearing deficit otherwise states pain is controlled. Patient denies dizziness, chest pain, shortness of breath, abdominal pain, N/V/D/C, numbess, tingling. No acute events overnight.    Objective:   Blood pressure 135/77, pulse 71, temperature 97.8 F (36.6 C), temperature source Oral, resp. rate 16, height 5\' 4"  (1.626 m), weight 49.896 kg (110 lb), SpO2 99 %.  Wt Readings from Last 3 Encounters:  01/10/15 49.896  kg (110 lb)  10/22/14 51.71 kg (114 lb)  08/22/14 48.263 kg (106 lb 6.4 oz)    No intake or output data in the 24 hours ending 01/11/15 1105  Exam  General: Alert and oriented x 3, NADHearing deficit   HEENT:  PERRLA, EOMI, Anicteric Sclera, mucous membranes moist.   Neck: Supple, no JVD, no masses  CVS: S1 S2 auscultated, no rubs, murmurs or gallops. Regular rate and rhythm.  Respiratory: Clear to auscultation bilaterally, no wheezing, rales or rhonchi  Abdomen: Soft, nontender, nondistended, + bowel sounds  Ext: no cyanosis clubbing or edema  Neuro:no new deficits   Skin: No rashes  Psych: Normal affect and demeanor, alert and oriented x3    Data Review   Micro Results No results found for this or any previous visit (from the past 240 hour(s)).  Radiology Reports Ct Head Wo Contrast  01/10/2015  CLINICAL DATA:  PT. SLID OFF BED AND HIT HEAD ON TABLE,  LAC TO RIGHT OCCIPITAL REGION, LW EXAM: CT HEAD WITHOUT CONTRAST CT CERVICAL SPINE WITHOUT CONTRAST TECHNIQUE: Multidetector CT imaging of the head and cervical spine was performed following the standard protocol without intravenous contrast. Multiplanar CT image reconstructions of the cervical spine were also generated. COMPARISON:  11/12/2009 FINDINGS: CT HEAD FINDINGS There is significant central and cortical atrophy. Periventricular white matter changes are consistent with small vessel disease. Asymmetry of the lateral ventricles is stable. Bone windows show mucoperiosteal thickening in the right maxillary sinus. No acute calvarial injury. Mastoid air cells are normally aerated. There is atherosclerotic calcification of the internal carotid arteries. CT CERVICAL SPINE FINDINGS There is significant degenerative change within the mid cervical spine, most notably at C5-6, C6-7. There is bilateral foraminal narrowing at C5-6 and C6-7. There is no acute fracture or subluxation. Lung apices are clear. There is significant atherosclerotic calcification of the aortic arch and branch vessels. Carotid arteries are densely calcified. IMPRESSION: 1. Atrophy and small vessel disease. 2.  No evidence for acute intracranial abnormality. 3. Mild chronic sinusitis. 4.  No evidence for acute cervical spine abnormality. Electronically Signed   By: Norva Pavlov M.D.   On: 01/10/2015 15:08   Ct Cervical Spine Wo Contrast  01/10/2015  CLINICAL DATA:  PT. SLID OFF BED AND HIT HEAD ON TABLE, LAC TO RIGHT OCCIPITAL REGION, LW EXAM: CT HEAD WITHOUT CONTRAST CT CERVICAL SPINE WITHOUT CONTRAST TECHNIQUE: Multidetector CT imaging of the head and cervical spine was performed following the standard protocol without intravenous contrast. Multiplanar CT image reconstructions of the cervical spine were also generated. COMPARISON:  11/12/2009 FINDINGS: CT HEAD FINDINGS There is significant central and cortical atrophy. Periventricular  white matter changes are consistent with small vessel disease. Asymmetry of the lateral ventricles is stable. Bone windows show mucoperiosteal thickening in the right maxillary sinus. No acute calvarial injury. Mastoid air cells are normally aerated. There is atherosclerotic calcification of the internal carotid arteries. CT CERVICAL SPINE FINDINGS There is significant degenerative change within the mid cervical spine, most notably at C5-6, C6-7. There is bilateral foraminal narrowing at C5-6 and C6-7. There is no acute fracture or subluxation. Lung apices are clear. There is significant atherosclerotic calcification of the aortic arch and branch vessels. Carotid arteries are densely calcified. IMPRESSION: 1. Atrophy and small vessel disease. 2.  No evidence for acute intracranial abnormality. 3. Mild chronic sinusitis. 4.  No evidence for acute cervical spine abnormality. Electronically Signed   By: Norva Pavlov M.D.   On: 01/10/2015 15:08  Ct Hip Right Wo Contrast  01/10/2015  CLINICAL DATA:  Right hip pain after fall. EXAM: CT OF THE RIGHT HIP WITHOUT CONTRAST TECHNIQUE: Multidetector CT imaging of the right hip was performed according to the standard protocol. Multiplanar CT image reconstructions were also generated. COMPARISON:  Radiographs from 01/10/2015 FINDINGS: A right hip prosthesis is in place. Streak artifact from the prosthesis mildly obscures surrounding bony structures. There new bony deformities in the right superior and inferior pubic ramus, but these for the most part seem to represent mature callus rather than acute bony discontinuity. They resemble healed fractures rather than acute fractures. These were not present on 11/10/2009. There is some increased irregularity along the greater trochanter cortical margin, for example on image 25 series 6. This is new from 11/10/2009 but also appears to be new bilaterally since that time, raising the likelihood that this is simply degenerative  spurring rather than a sign of a fracture. However, on consecutive images 16 through 22 of series 8 there is slight cortical step-off tracking anteriorly along the right proximal femoral metaphysis suspicious for a small periprosthetic fracture. This is nondisplaced and highly subtle. There is a small chance that this could represent spurring but the location seems atypical. Iliac atherosclerosis noted. IMPRESSION: 1. Suspicion for a small nondisplaced periprosthetic fracture anteriorly along the right femur subtrochanteric region. Admittedly this is a subtle finding and could conceivably be due to spurring. Reference images 16-22 of series 8. 2. Bony deformities in the right pubic rami are new from 2011 but seen to represent mature callus compatible with old pubic ramus fractures. 3. Streak artifact from the implant reduces diagnostic sensitivity/specificity. 4. Iliac atherosclerosis. Electronically Signed   By: Gaylyn RongWalter  Liebkemann M.D.   On: 01/10/2015 18:18   Dg Hip Unilat With Pelvis 2-3 Views Right  01/10/2015  CLINICAL DATA:  Fall last night from pad with right lower extremity pain. Initial encounter. EXAM: DG HIP (WITH OR WITHOUT PELVIS) 2-3V RIGHT COMPARISON:  None. FINDINGS: No acute fracture or dislocation is identified. A bipolar hemiarthroplasty shows normal alignment. The bony pelvis shows osteopenia. Vascular calcifications are present. Soft tissues are unremarkable. IMPRESSION: No acute fracture identified. A bipolar arthroplasty shows normal alignment. Electronically Signed   By: Irish LackGlenn  Yamagata M.D.   On: 01/10/2015 14:35   Dg Femur, Min 2 Views Right  01/10/2015  CLINICAL DATA:  Fall last night; pt stated she slid from bed and landed right side as she was putting on pajamas. Insists she did not fall but slid. Pain midshaft right femur Hx prior right surgery 2012, cannot recall date of left hip surgery states "years and years" ago EXAM: RIGHT FEMUR 2 VIEWS COMPARISON:  01/10/2015 hip  FINDINGS: Patient has had previous right hip arthroplasty. The femur is intact. Bones appear radiolucent. There is dense atherosclerotic calcification of the right femoral and popliteal artery. Degenerative changes are seen at the knee. Evaluation of the knee is degraded by technique. Dedicated knee views are recommended if needed. IMPRESSION: No evidence for acute fracture of the femur. Electronically Signed   By: Norva PavlovElizabeth  Brown M.D.   On: 01/10/2015 14:33    CBC  Recent Labs Lab 01/10/15 1351 01/11/15 0259  WBC 7.6 7.2  HGB 14.5 12.8  HCT 43.7 38.6  PLT 351 339  MCV 92.2 92.3  MCH 30.6 30.6  MCHC 33.2 33.2  RDW 13.2 13.2  LYMPHSABS 1.2  --   MONOABS 0.7  --   EOSABS 0.3  --   BASOSABS 0.0  --  Chemistries   Recent Labs Lab 01/10/15 1351 01/11/15 0259  NA 138 138  K 3.9 3.3*  CL 101 102  CO2 27 29  GLUCOSE 99 83  BUN 12 12  CREATININE 1.09* 1.08*  CALCIUM 10.1 9.3   ------------------------------------------------------------------------------------------------------------------ estimated creatinine clearance is 21.8 mL/min (by C-G formula based on Cr of 1.08). ------------------------------------------------------------------------------------------------------------------ No results for input(s): HGBA1C in the last 72 hours. ------------------------------------------------------------------------------------------------------------------ No results for input(s): CHOL, HDL, LDLCALC, TRIG, CHOLHDL, LDLDIRECT in the last 72 hours. ------------------------------------------------------------------------------------------------------------------ No results for input(s): TSH, T4TOTAL, T3FREE, THYROIDAB in the last 72 hours.  Invalid input(s): FREET3 ------------------------------------------------------------------------------------------------------------------ No results for input(s): VITAMINB12, FOLATE, FERRITIN, TIBC, IRON, RETICCTPCT in the last 72  hours.  Coagulation profile  Recent Labs Lab 01/10/15 1351  INR 1.06    No results for input(s): DDIMER in the last 72 hours.  Cardiac Enzymes No results for input(s): CKMB, TROPONINI, MYOGLOBIN in the last 168 hours.  Invalid input(s): CK ------------------------------------------------------------------------------------------------------------------ Invalid input(s): POCBNP  No results for input(s): GLUCAP in the last 72 hours.   RAI,RIPUDEEP M.D. [facility] 01/11/2015, 11:05 AM  Pager: [phone] Between 7am to 7pm - call Pager - [phone]  After 7pm go to [email or web address] - password [email or web address]  Call night coverage person covering after 7pm

## 2015-01-12 DIAGNOSIS — S72001A Fracture of unspecified part of neck of right femur, initial encounter for closed fracture: Secondary | ICD-10-CM | POA: Diagnosis not present

## 2015-01-12 DIAGNOSIS — W19XXXD Unspecified fall, subsequent encounter: Secondary | ICD-10-CM | POA: Diagnosis not present

## 2015-01-12 DIAGNOSIS — F418 Other specified anxiety disorders: Secondary | ICD-10-CM | POA: Diagnosis not present

## 2015-01-12 DIAGNOSIS — I10 Essential (primary) hypertension: Secondary | ICD-10-CM | POA: Diagnosis not present

## 2015-01-12 LAB — URINALYSIS, ROUTINE W REFLEX MICROSCOPIC
Bilirubin Urine: NEGATIVE
GLUCOSE, UA: NEGATIVE mg/dL
Hgb urine dipstick: NEGATIVE
KETONES UR: NEGATIVE mg/dL
LEUKOCYTES UA: NEGATIVE
Nitrite: POSITIVE — AB
PROTEIN: NEGATIVE mg/dL
Specific Gravity, Urine: 1.01 (ref 1.005–1.030)
UROBILINOGEN UA: 1 mg/dL (ref 0.0–1.0)
pH: 7 (ref 5.0–8.0)

## 2015-01-12 LAB — BASIC METABOLIC PANEL
ANION GAP: 5 (ref 5–15)
BUN: 14 mg/dL (ref 6–20)
CHLORIDE: 102 mmol/L (ref 101–111)
CO2: 31 mmol/L (ref 22–32)
Calcium: 9.7 mg/dL (ref 8.9–10.3)
Creatinine, Ser: 1.12 mg/dL — ABNORMAL HIGH (ref 0.44–1.00)
GFR, EST AFRICAN AMERICAN: 45 mL/min — AB (ref >60)
GFR, EST NON AFRICAN AMERICAN: 39 mL/min — AB (ref >60)
Glucose, Bld: 92 mg/dL (ref 65–99)
POTASSIUM: 4.4 mmol/L (ref 3.5–5.1)
SODIUM: 138 mmol/L (ref 135–145)

## 2015-01-12 LAB — CBC
HCT: 41.3 % (ref 36.0–46.0)
HEMOGLOBIN: 13.6 g/dL (ref 12.0–15.0)
MCH: 30.4 pg (ref 26.0–34.0)
MCHC: 32.9 g/dL (ref 30.0–36.0)
MCV: 92.4 fL (ref 78.0–100.0)
PLATELETS: 318 10*3/uL (ref 150–400)
RBC: 4.47 MIL/uL (ref 3.87–5.11)
RDW: 13.3 % (ref 11.5–15.5)
WBC: 8.7 10*3/uL (ref 4.0–10.5)

## 2015-01-12 LAB — URINE MICROSCOPIC-ADD ON

## 2015-01-12 NOTE — Progress Notes (Signed)
[facility]                                                                              Patient Demographics  Veronica [name], is a 81 y.o. female, DOB - 10/02/1912, RUE:[id number]  Admit date - 01/10/2015   Admitting Physician Ron Parker, MD  Outpatient Primary MD for the patient is Kirt Boys, DO  LOS - 2   Chief Complaint  Patient presents with  . Hip Pain       Brief HPI   Veronica [name] is a 81 y.o. female who suffered a fall when she slid off her bed while getting dressed for bed last night. Her fall was witnessed and she suffered right sided pain and a scalp laceration. In the AM she could not bear weight on her right leg and she was brought to the ED. CT scan of the Hip revealed a small intertrochanteric Fracture of the Right Hip   Assessment & Plan    Principal Problem:   Right Hip fracture [facility])- currently nonoperative per orthopedics - Orthopedics consulted, patient seen by Dr. Ophelia Charter, per recommendations appears to be small nondisplaced fracture line in the proximal femur, hip prosthesis well-seated. Recommended he remain nonweightbearing on right hip for next 4-6 weeks to allow for healing of the fracture, likely no further surgeries needed. - Start PT today  Active Problems:   Depression with anxiety - Currently stable, continue Lexapro    OAB (overactive bladder) - Continue Vesicare    Hearing loss    Essential hypertension -Currently stable, continue HCTZ    Scalp laceration, fall - No sutures were needed, Neosporin    History of right shoulder fracture - Pain control as needed   Code Status: DO NOT RESUSCITATE   Family Communication: Discussed in detail with the patient, all imaging results, lab results explained to the patient    Disposition Plan: Will need skilled nursing facility   Time Spent in minutes   15 minutes  Procedures  None   Consults   ortho  DVT Prophylaxis   SCD's  Medications  Scheduled Meds: . darifenacin  7.5 mg Oral Daily  . escitalopram  10 mg Oral Daily  . hydrochlorothiazide  12.5 mg Oral Daily  . mirtazapine  15 mg Oral QHS  . multivitamin with minerals  1 tablet Oral Daily  . sodium chloride  3 mL Intravenous Q12H  . Tdap  0.5 mL Intramuscular Once   Continuous Infusions:  PRN Meds:.sodium chloride, acetaminophen **OR** acetaminophen, alum & mag hydroxide-simeth, hydrALAZINE, HYDROmorphone (DILAUDID) injection, ondansetron **OR** ondansetron (ZOFRAN) IV, oxyCODONE, sodium chloride   Antibiotics   Anti-infectives    None        Subjective:   Veronica [name] was seen and examined today. Hearing deficit otherwise states pain is controlled. Denies any complaints. Patient denies dizziness, chest pain, shortness of breath, abdominal pain, N/V/D/C, numbess, tingling. No acute events overnight.  No fevers or chills.  Objective:   Blood pressure 115/53, pulse 64, temperature 97.8 F (36.6 C), temperature source Oral, resp. rate 16, height  (1.626 m), weight 49.896 kg (110 lb), SpO2 95 %.  Wt Readings from Last 3 Encounters:  01/10/15 49.896 kg (110 lb)  10/22/14 51.71 kg (114 lb)  08/22/14 48.263 kg (106 lb 6.4 oz)     Intake/Output Summary (Last 24 hours) at 01/12/15 1121 Last data filed at 01/12/15 0815  Gross per 24 hour  Intake    480 ml  Output      0 ml  Net    480 ml    Exam  General: Alert and oriented x 3, NAD, Hearing deficit   HEENT:  PERRLA, EOMI  Neck: Supple, no JVD, no masses  CVS: S1 S2 clear, RRR  Respiratory: CTAB  Abdomen: Soft, NT, ND, NBS  Ext: no cyanosis clubbing or edema  Neuro:no new deficits   Skin: No rashes  Psych: Normal affect and demeanor, alert and oriented x3    Data Review   Micro Results No results found for this or any previous visit (from the past 240 hour(s)).  Radiology Reports Ct Head Wo Contrast  01/10/2015  CLINICAL DATA:  PT. SLID OFF BED AND  HIT HEAD ON TABLE, LAC TO RIGHT OCCIPITAL REGION, LW EXAM: CT HEAD WITHOUT CONTRAST CT CERVICAL SPINE WITHOUT CONTRAST TECHNIQUE: Multidetector CT imaging of the head and cervical spine was performed following the standard protocol without intravenous contrast. Multiplanar CT image reconstructions of the cervical spine were also generated. COMPARISON:  11/12/2009 FINDINGS: CT HEAD FINDINGS There is significant central and cortical atrophy. Periventricular white matter changes are consistent with small vessel disease. Asymmetry of the lateral ventricles is stable. Bone windows show mucoperiosteal thickening in the right maxillary sinus. No acute calvarial injury. Mastoid air cells are normally aerated. There is atherosclerotic calcification of the internal carotid arteries. CT CERVICAL SPINE FINDINGS There is significant degenerative change within the mid cervical spine, most notably at C5-6, C6-7. There is bilateral foraminal narrowing at C5-6 and C6-7. There is no acute fracture or subluxation. Lung apices are clear. There is significant atherosclerotic calcification of the aortic arch and branch vessels. Carotid arteries are densely calcified. IMPRESSION: 1. Atrophy and small vessel disease. 2.  No evidence for acute intracranial abnormality. 3. Mild chronic sinusitis. 4.  No evidence for acute cervical spine abnormality. Electronically Signed   By: Norva PavlovElizabeth  Brown M.D.   On: 01/10/2015 15:08   Ct Cervical Spine Wo Contrast  01/10/2015  CLINICAL DATA:  PT. SLID OFF BED AND HIT HEAD ON TABLE, LAC TO RIGHT OCCIPITAL REGION, LW EXAM: CT HEAD WITHOUT CONTRAST CT CERVICAL SPINE WITHOUT CONTRAST TECHNIQUE: Multidetector CT imaging of the head and cervical spine was performed following the standard protocol without intravenous contrast. Multiplanar CT image reconstructions of the cervical spine were also generated. COMPARISON:  11/12/2009 FINDINGS: CT HEAD FINDINGS There is significant central and cortical atrophy.  Periventricular white matter changes are consistent with small vessel disease. Asymmetry of the lateral ventricles is stable. Bone windows show mucoperiosteal thickening in the right maxillary sinus. No acute calvarial injury. Mastoid air cells are normally aerated. There is atherosclerotic calcification of the internal carotid arteries. CT CERVICAL SPINE FINDINGS There is significant degenerative change within the mid cervical spine, most notably at C5-6, C6-7. There is bilateral foraminal narrowing at C5-6 and C6-7. There is no acute fracture or subluxation. Lung apices are clear. There is significant atherosclerotic calcification of the aortic arch and branch vessels. Carotid arteries are densely calcified. IMPRESSION: 1. Atrophy and small vessel disease. 2.  No evidence for acute intracranial abnormality. 3. Mild chronic sinusitis. 4.  No evidence for acute  cervical spine abnormality. Electronically Signed   By: Norva Pavlov M.D.   On: 01/10/2015 15:08   Ct Hip Right Wo Contrast  01/10/2015  CLINICAL DATA:  Right hip pain after fall. EXAM: CT OF THE RIGHT HIP WITHOUT CONTRAST TECHNIQUE: Multidetector CT imaging of the right hip was performed according to the standard protocol. Multiplanar CT image reconstructions were also generated. COMPARISON:  Radiographs from 01/10/2015 FINDINGS: A right hip prosthesis is in place. Streak artifact from the prosthesis mildly obscures surrounding bony structures. There new bony deformities in the right superior and inferior pubic ramus, but these for the most part seem to represent mature callus rather than acute bony discontinuity. They resemble healed fractures rather than acute fractures. These were not present on 11/10/2009. There is some increased irregularity along the greater trochanter cortical margin, for example on image 25 series 6. This is new from 11/10/2009 but also appears to be new bilaterally since that time, raising the likelihood that this is simply  degenerative spurring rather than a sign of a fracture. However, on consecutive images 16 through 22 of series 8 there is slight cortical step-off tracking anteriorly along the right proximal femoral metaphysis suspicious for a small periprosthetic fracture. This is nondisplaced and highly subtle. There is a small chance that this could represent spurring but the location seems atypical. Iliac atherosclerosis noted. IMPRESSION: 1. Suspicion for a small nondisplaced periprosthetic fracture anteriorly along the right femur subtrochanteric region. Admittedly this is a subtle finding and could conceivably be due to spurring. Reference images 16-22 of series 8. 2. Bony deformities in the right pubic rami are new from 2011 but seen to represent mature callus compatible with old pubic ramus fractures. 3. Streak artifact from the implant reduces diagnostic sensitivity/specificity. 4. Iliac atherosclerosis. Electronically Signed   By: Gaylyn Rong M.D.   On: 01/10/2015 18:18   Dg Hip Unilat With Pelvis 2-3 Views Right  01/10/2015  CLINICAL DATA:  Fall last night from pad with right lower extremity pain. Initial encounter. EXAM: DG HIP (WITH OR WITHOUT PELVIS) 2-3V RIGHT COMPARISON:  None. FINDINGS: No acute fracture or dislocation is identified. A bipolar hemiarthroplasty shows normal alignment. The bony pelvis shows osteopenia. Vascular calcifications are present. Soft tissues are unremarkable. IMPRESSION: No acute fracture identified. A bipolar arthroplasty shows normal alignment. Electronically Signed   By: Irish Lack M.D.   On: 01/10/2015 14:35   Dg Femur, Min 2 Views Right  01/10/2015  CLINICAL DATA:  Fall last night; pt stated she slid from bed and landed right side as she was putting on pajamas. Insists she did not fall but slid. Pain midshaft right femur Hx prior right surgery 2012, cannot recall date of left hip surgery states "years and years" ago EXAM: RIGHT FEMUR 2 VIEWS COMPARISON:  01/10/2015  hip FINDINGS: Patient has had previous right hip arthroplasty. The femur is intact. Bones appear radiolucent. There is dense atherosclerotic calcification of the right femoral and popliteal artery. Degenerative changes are seen at the knee. Evaluation of the knee is degraded by technique. Dedicated knee views are recommended if needed. IMPRESSION: No evidence for acute fracture of the femur. Electronically Signed   By: Norva Pavlov M.D.   On: 01/10/2015 14:33    CBC  Recent Labs Lab 01/10/15 1351 01/11/15 0259 01/12/15 0317  WBC 7.6 7.2 8.7  HGB 14.5 12.8 13.6  HCT 43.7 38.6 41.3  PLT 351 339 318  MCV 92.2 92.3 92.4  MCH 30.6 30.6 30.4  MCHC 33.2  33.2 32.9  RDW 13.2 13.2 13.3  LYMPHSABS 1.2  --   --   MONOABS 0.7  --   --   EOSABS 0.3  --   --   BASOSABS 0.0  --   --     Chemistries   Recent Labs Lab 01/10/15 1351 01/11/15 0259 01/12/15 0317  NA 138 138 138  K 3.9 3.3* 4.4  CL 101 102 102  CO2 GLUCOSE 99 83 92  BUN CREATININE 1.09* 1.08* 1.12*  CALCIUM 10.1 9.3 9.7   ------------------------------------------------------------------------------------------------------------------ estimated creatinine clearance is 21 mL/min (by C-G formula based on Cr of 1.12). ------------------------------------------------------------------------------------------------------------------ No results for input(s): HGBA1C in the last 72 hours. ------------------------------------------------------------------------------------------------------------------ No results for input(s): CHOL, HDL, LDLCALC, TRIG, CHOLHDL, LDLDIRECT in the last 72 hours. ------------------------------------------------------------------------------------------------------------------ No results for input(s): TSH, T4TOTAL, T3FREE, THYROIDAB in the last 72 hours.  Invalid input(s):  FREET3 ------------------------------------------------------------------------------------------------------------------ No results for input(s): VITAMINB12, FOLATE, FERRITIN, TIBC, IRON, RETICCTPCT in the last 72 hours.  Coagulation profile  Recent Labs Lab 01/10/15 1351  INR 1.06    No results for input(s): DDIMER in the last 72 hours.  Cardiac Enzymes No results for input(s): CKMB, TROPONINI, MYOGLOBIN in the last 168 hours.  Invalid input(s): CK ------------------------------------------------------------------------------------------------------------------ Invalid input(s): POCBNP  No results for input(s): GLUCAP in the last 72 hours.   Tove Wideman M.D. [facility] 01/12/2015, 11:21 AM  Pager: [phone] Between 7am to 7pm - call Pager - [phone]  After 7pm go to [email or web address] - password [email or web address]  Call night coverage person covering after 7pm

## 2015-01-12 NOTE — Evaluation (Signed)
Physical Therapy Evaluation Patient Details Name: Veronica [name] MRN: [id number] DOB: 05-16-1912 Today's Date: 01/12/2015   History of Present Illness  Veronica [name] is a [age] y.o. female who suffered a fall when she slid off her bed while getting dressed for bed last night. Her fall was witnessed and she suffered right sided pain and a scalp laceration. In the AM she could not bear weight on her right leg and she was brought to the ED. CT scan of the Hip revealed a small intertrochanteric Fracture of the Right Hip; Opted for conservative management, will be NWB Right LE for 4-6 weeks  Clinical Impression   Pt admitted with above diagnosis. Pt currently with functional limitations due to the deficits listed below (see PT Problem List).   Veronica [name] overall is moving well; her pressing issue is keeping NWB RLE with mobility -- which she is unable to do without moderate assist in standing; Still, I anticipate she may be able to perform lateral scoot transfers relatively well, and will include scoot pivot and wheelchair management in our plan;    Pt will benefit from skilled PT to increase their independence and safety with mobility to allow discharge to the venue listed below.       Follow Up Recommendations SNF; Noted pt is OBS status, and will need to confirm that her insurance will cover SNF; If SNF stay for post-acute rehab is not covered, would recommend to following for home:  24 hour assistance (can family arrange for 24 hour assist?)  HHPT/OT/Aide  Wheelchair with removable armrest  Drop-arm bedside commode  Tub transfer bench    Equipment Recommendations  Rolling walker with 5" wheels;Wheelchair (measurements PT);Wheelchair cushion (measurements PT) (drop-arm bedside commode)    Recommendations for Other Services OT consult     Precautions / Restrictions Precautions Precautions: Fall Restrictions Weight Bearing Restrictions: Yes RLE Weight Bearing: Non weight  bearing      Mobility  Bed Mobility Overal bed mobility: Needs Assistance Bed Mobility: Supine to Sit     Supine to sit: Min assist     General bed mobility comments: min handheld assist to pull to sit; minimal pain with bed mobility  Transfers Overall transfer level: Needs assistance Equipment used: 2 person hand held assist Transfers: Stand Pivot Transfers   Stand pivot transfers: +2 safety/equipment;Mod assist       General transfer comment: Basic stand pivot transfer towards pt's full weight bearing L [facility]; Needed mod assist to keep NWB RLE  Ambulation/Gait             General Gait Details: Held amb for today due to difficulty keeping NWB RLE  Stairs            Wheelchair Mobility    Modified Rankin (Stroke Patients Only)       Balance Overall balance assessment: Needs assistance Sitting-balance support: Bilateral upper extremity supported Sitting balance-Leahy Scale: Good       Standing balance-Leahy Scale: Poor                               Pertinent Vitals/Pain Pain Assessment: No/denies pain    Home Living Family/patient expects to be discharged to:: Private residence Living Arrangements: Children (Recently retired son, Veronica [name]) Available Help at Discharge: Available PRN/intermittently;Family (Will need to find out how much family help will be available) Type of Home: House Home Access: Level entry  Home Layout: One level        Prior Function Level of Independence: Independent               Hand Dominance        Extremity/Trunk Assessment   Upper Extremity Assessment: Generalized weakness           Lower Extremity Assessment: RLE deficits/detail;Generalized weakness RLE Deficits / Details: No noted incr pain with moving RLE, though kept to minimal ROM on eval; noting difficulty keeping NWB RLE, likely more due to LLE generalized  weakness    Cervical / Trunk Assessment:  Normal  Communication   Communication: HOH  Cognition Arousal/Alertness: Awake/alert Behavior During Therapy: WFL for tasks assessed/performed Overall Cognitive Status: Within Functional Limits for tasks assessed                      General Comments      Exercises        Assessment/Plan    PT Assessment Patient needs continued PT services  PT Diagnosis Difficulty walking;Generalized weakness   PT Problem List Decreased strength;Decreased range of motion;Decreased activity tolerance;Decreased balance;Decreased mobility;Decreased knowledge of use of DME;Decreased safety awareness;Decreased knowledge of precautions  PT Treatment Interventions DME instruction;Gait training;Functional mobility training;Therapeutic activities;Therapeutic exercise;Balance training;Patient/family education;Wheelchair mobility training   PT Goals (Current goals can be found in the Care Plan section) Acute Rehab PT Goals Patient Stated Goal: her imminent goal was to get to bedside commode to void PT Goal Formulation: With patient Time For Goal Achievement: 01/26/15 Potential to Achieve Goals: Good    Frequency Min 5X/week (May decr Acute PT frequency if dc plan is confirmed SNF)   Barriers to discharge   Will need more info re: available home assist; Can family come together to arrange for 24 hour assist if needed?    Co-evaluation               End of Session Equipment Utilized During Treatment: Gait belt Activity Tolerance: Patient tolerated treatment well Patient left: in chair;with call bell/phone within reach;with chair alarm set Nurse Communication: Mobility status    Functional Assessment Tool Used: Clinical Judgement Functional Limitation: Mobility: Walking and moving around Mobility: Walking and Moving Around Current Status (W0981): At least 20 percent but less than 40 percent impaired, limited or restricted Mobility: Walking and Moving Around Goal Status [phone]): 0  percent impaired, limited or restricted    Time: 8295-6213 PT Time Calculation (min) (ACUTE ONLY): 16 min   Charges:   PT Evaluation $Initial PT Evaluation Tier I: 1 Procedure     PT G Codes:   PT G-Codes **NOT FOR INPATIENT CLASS** Functional Assessment Tool Used: Clinical Judgement Functional Limitation: Mobility: Walking and moving around Mobility: Walking and Moving Around Current Status ([id number]): At least 20 percent but less than 40 percent impaired, limited or restricted Mobility: Walking and Moving Around Goal Status [phone]): 0 percent impaired, limited or restricted    Van Clines Hamff 01/12/2015, 2:09 PM  Van Clines, PT  Acute Rehabilitation Services Pager [phone] Office [phone]

## 2015-01-12 NOTE — [place] FL2 (Signed)
MEDICAID FL2 LEVEL OF CARE SCREENING TOOL     IDENTIFICATION  Patient Name: Veronica [name] Birthdate: April 24, 1912 Sex: female Admission Date (Current Location): 01/10/2015  [facility] [place]   Facility and Address:  [facility] [place] [facility], [address], [place], [place] [address]      Provider Number: [id number]  Attending Physician Name and Address:  Cathren Harshipudeep K Rai, MD  Relative Name and Phone Number:  Lauralyn PrimesMichael Wrench patient's son [phone]    Current Level of Care: Hospital Recommended Level of Care: Skilled Nursing Facility Prior Approval Number:    Date Approved/Denied:   PASRR Number: [id number]  Discharge Plan: SNF    Current Diagnoses: Patient Active Problem List   Diagnosis Date Noted  . Hip fracture (HCC) 01/10/2015  . Essential hypertension 01/10/2015  . Scalp laceration 01/10/2015  . Fall 01/10/2015  . History of right shoulder fracture 01/10/2015  . Arthritis, multiple joint involvement 05/02/2014  . Depression with anxiety 05/02/2014  . Cholelithiasis 05/02/2014  . OAB (overactive bladder) 05/02/2014  . Loss of weight 05/02/2014  . Memory loss, short term 05/02/2014  . Hearing loss 05/02/2014  . Insomnia 05/02/2014    Orientation ACTIVITIES/SOCIAL BLADDER RESPIRATION    Self, Place, Situation    Incontinent Normal  BEHAVIORAL SYMPTOMS/MOOD NEUROLOGICAL BOWEL NUTRITION STATUS      Continent Diet (Regular diet)  PHYSICIAN VISITS COMMUNICATION OF NEEDS Height & Weight Skin    Verbally 5\' 4"  (162.6 cm) 110 lbs. Surgical wounds          AMBULATORY STATUS RESPIRATION    Supervision limited Normal      Personal Care Assistance Level of Assistance  Bathing, Dressing Bathing Assistance: Limited assistance   Dressing Assistance: Limited assistance      Functional Limitations Info  Hearing   Hearing Info: Impaired         SPECIAL CARE FACTORS FREQUENCY  PT (By licensed PT)     PT  Frequency: 5x per week             Additional Factors Info  Code Status, Allergies Code Status Info: DNR Allergies Info: No known allergies           Current Medications (01/12/2015): Current Facility-Administered Medications  Medication Dose Route Frequency Provider Last Rate Last Dose  . 0.9 %  sodium chloride infusion  250 mL Intravenous PRN Ron ParkerHarvette C Jenkins, MD      . acetaminophen (TYLENOL) tablet 650 mg  650 mg Oral Q6H PRN Ron ParkerHarvette C Jenkins, MD   650 mg at 01/10/15 2335   Or  . acetaminophen (TYLENOL) suppository 650 mg  650 mg Rectal Q6H PRN Ron ParkerHarvette C Jenkins, MD      . alum & mag hydroxide-simeth (MAALOX/MYLANTA) 200-200-20 MG/5ML suspension 30 mL  30 mL Oral Q6H PRN Ron ParkerHarvette C Jenkins, MD      . darifenacin (ENABLEX) 24 hr tablet 7.5 mg  7.5 mg Oral Daily Ron ParkerHarvette C Jenkins, MD   7.5 mg at 01/12/15 0958  . escitalopram (LEXAPRO) tablet 10 mg  10 mg Oral Daily Ron ParkerHarvette C Jenkins, MD   10 mg at 01/12/15 [phone]  . hydrALAZINE (APRESOLINE) injection 10 mg  10 mg Intravenous Q6H PRN Ron ParkerHarvette C Jenkins, MD      . hydrochlorothiazide (MICROZIDE) capsule 12.5 mg  12.5 mg Oral Daily Ron ParkerHarvette C Jenkins, MD   12.5 mg at 01/12/15 0958  . HYDROmorphone (DILAUDID) injection 0.5-1 mg  0.5-1 mg Intravenous Q3H PRN Harvette C  Lovell Sheehan, MD      . mirtazapine (REMERON) tablet 15 mg  15 mg Oral QHS Ron Parker, MD   15 mg at 01/11/15 2149  . multivitamin with minerals tablet 1 tablet  1 tablet Oral Daily Ron Parker, MD   1 tablet at 01/12/15 [phone]  . ondansetron (ZOFRAN) tablet 4 mg  4 mg Oral Q6H PRN Ron Parker, MD       Or  . ondansetron (ZOFRAN) injection 4 mg  4 mg Intravenous Q6H PRN Ron Parker, MD      . oxyCODONE (Oxy IR/ROXICODONE) immediate release tablet 5 mg  5 mg Oral Q4H PRN Ron Parker, MD   5 mg at 01/10/15 2334  . sodium chloride 0.9 % injection 3 mL  3 mL Intravenous Q12H Ron Parker, MD   3 mL at 01/11/15 0600  . sodium chloride  0.9 % injection 3 mL  3 mL Intravenous PRN Ron Parker, MD      . Tdap (BOOSTRIX) injection 0.5 mL  0.5 mL Intramuscular Once Everlene Farrier, PA-C       Do not use this list as official medication orders. Please verify with discharge summary.  Discharge Medications:   Medication List    ASK your doctor about these medications        celecoxib 100 MG capsule  Commonly known as:  CELEBREX  Take 1 capsule (100 mg total) by mouth 2 (two) times daily.     CVS SPECTRAVITE ADULT 50+ Tabs  Take 1 tablet by mouth daily.     escitalopram 10 MG tablet  Commonly known as:  LEXAPRO  Take 1 tablet (10 mg total) by mouth daily.     hydrochlorothiazide 12.5 MG capsule  Commonly known as:  MICROZIDE  Take 12.5 mg by mouth daily.     mirtazapine 15 MG tablet  Commonly known as:  REMERON  Take 1 tablet (15 mg total) by mouth at bedtime.     solifenacin 5 MG tablet  Commonly known as:  VESICARE  Take 1 tablet (5 mg total) by mouth daily.     Tdap 5-2.5-18.5 LF-MCG/0.5 injection  Commonly known as:  BOOSTRIX  Inject 0.5 mLs into the muscle once.     zoster vaccine live (PF) 19400 UNT/0.65ML injection  Commonly known as:  ZOSTAVAX  Inject 19,400 Units into the skin once.        Relevant Imaging Results:  Relevant Lab Results:  Recent Labs    Additional Information    Jammal Sarr, Ervin Knack, LCSWA

## 2015-01-12 NOTE — Clinical Social Work Placement (Signed)
   CLINICAL SOCIAL WORK PLACEMENT  NOTE  Date:  01/12/2015  Patient Details  Name: Veronica [name] MRN: [id number] Date of Birth: 07-13-1912  Clinical Social Work is seeking post-discharge placement for this patient at the Skilled  Nursing Facility level of care (*CSW will initial, date and re-position this form in  chart as items are completed):  Yes   Patient/family provided with [facility] Clinical Social Work Department's list of facilities offering this level of care within the geographic area requested by the patient (or if unable, by the patient's family).  Yes   Patient/family informed of their freedom to choose among providers that offer the needed level of care, that participate in Medicare, Medicaid or managed care program needed by the patient, have an available bed and are willing to accept the patient.  Yes   Patient/family informed of [facility]'s ownership interest in [facility] and [facility], as well as of the fact that they are under no obligation to receive care at these facilities.  PASRR submitted to EDS on 01/12/15     PASRR number received on       Existing PASRR number confirmed on 01/12/15     FL2 transmitted to all facilities in geographic area requested by pt/family on 01/12/15     FL2 transmitted to all facilities within larger geographic area on       Patient informed that his/her managed care company has contracts with or will negotiate with certain facilities, including the following:            Patient/family informed of bed offers received.  Patient chooses bed at       Physician recommends and patient chooses bed at      Patient to be transferred to   on  .  Patient to be transferred to facility by       Patient family notified on   of transfer.  Name of family member notified:        PHYSICIAN Please sign FL2     Additional Comment:    _______________________________________________ Darleene CleaverAnterhaus, Dalma Panchal R,  LCSWA 01/12/2015, 5:07 PM

## 2015-01-12 NOTE — Clinical Social Work Note (Signed)
Clinical Social Work Assessment  Patient Details  Name: Veronica [name] MRN: [id number] Date of Birth: 01-30-1913  Date of referral:  01/12/15               Reason for consult:  Facility Placement                Permission sought to share information with:  Family Supports, Magazine features editoracility Contact Representative Permission granted to share information::  Yes, Verbal Permission Granted  Name::     Veronica [name] patient's son [phone]  Agency::  SNF admissions  Relationship::     Contact Information:     Housing/Transportation Living arrangements for the past 2 months:  Single Family Home Source of Information:  Patient, Adult Children Patient Interpreter Needed:  None Criminal Activity/Legal Involvement Pertinent to Current Situation/Hospitalization:  No - Comment as needed Significant Relationships:  Adult Children Lives with:  Adult Children Do you feel safe going back to the place where you live?  Yes (Patient's family feels she needs some short term rehab in order to return back home.) Need for family participation in patient care:  Yes (Comment) (Patient is hard of hearing and is requesting sons to make decisions for her.)  Care giving concerns:  Patient and her son's feel she needs some short term rehab in order to return back home.   Social Worker assessment / plan:  Patient is a 81 year old female who lives with her son Veronica [name], patient is hard of hearing, but alert and oriented x4.  Patient requested that CSW speak with her sons to help with decision making for SNF placement.  CSW contacted patient's son to explain SNF placement process and role of CSW.  Patient's son stated that he would like patient [facility], but if she can not get into there, he is okay with her [facility].  Patient's son was explained how insurance pays for SNF stay and what to expect at a SNF for short term rehab.  CSW explained  process to patient's son, patient's son expressed he did not have any other questions.  Employment status:  Retired Database administratornsurance information:  Managed Medicare PT Recommendations:  Skilled Nursing Facility Information / Referral to community resources:  Skilled Nursing Facility  Patient/Family's Response to care:  Patient and family agreeable to going to SNF for short term rehab.  Patient/Family's Understanding of and Emotional Response to Diagnosis, Current Treatment, and Prognosis:  Patient's son is aware of current treatment plan and diagnosis.  Emotional Assessment Appearance:  Appears stated age Attitude/Demeanor/Rapport:    Affect (typically observed):    Orientation:  Oriented to  Time, Oriented to Place, Oriented to Self Alcohol / Substance use:  Not Applicable Psych involvement (Current and /or in the community):  No (Comment)  Discharge Needs  Concerns to be addressed:  No discharge needs identified Readmission within the last 30 days:  No Current discharge risk:  None Barriers to Discharge:  Insurance Authorization   [place] Const[place], Hristopher Missildine R, LCSWA 01/12/2015, 4:05 PM

## 2015-01-13 DIAGNOSIS — I10 Essential (primary) hypertension: Secondary | ICD-10-CM | POA: Diagnosis not present

## 2015-01-13 DIAGNOSIS — W19XXXD Unspecified fall, subsequent encounter: Secondary | ICD-10-CM | POA: Diagnosis not present

## 2015-01-13 DIAGNOSIS — S72001A Fracture of unspecified part of neck of right femur, initial encounter for closed fracture: Secondary | ICD-10-CM | POA: Diagnosis not present

## 2015-01-13 DIAGNOSIS — F418 Other specified anxiety disorders: Secondary | ICD-10-CM | POA: Diagnosis not present

## 2015-01-13 LAB — BASIC METABOLIC PANEL
ANION GAP: 6 (ref 5–15)
BUN: 16 mg/dL (ref 6–20)
CALCIUM: 9.4 mg/dL (ref 8.9–10.3)
CO2: 29 mmol/L (ref 22–32)
CREATININE: 1.3 mg/dL — AB (ref 0.44–1.00)
Chloride: 102 mmol/L (ref 101–111)
GFR calc Af Amer: 38 mL/min — ABNORMAL LOW (ref >60)
GFR, EST NON AFRICAN AMERICAN: 33 mL/min — AB (ref >60)
Glucose, Bld: 93 mg/dL (ref 65–99)
Potassium: 3.6 mmol/L (ref 3.5–5.1)
SODIUM: 137 mmol/L (ref 135–145)

## 2015-01-13 MED ORDER — OXYCODONE HCL 5 MG PO TABS: 5.0000 mg | ORAL_TABLET | Freq: Four times a day (QID) | ORAL | Status: DC | PRN

## 2015-01-13 MED ORDER — CIPROFLOXACIN HCL 500 MG PO TABS: 250.0000 mg | ORAL_TABLET | Freq: Two times a day (BID) | ORAL | Status: DC

## 2015-01-13 MED ORDER — ACETAMINOPHEN 325 MG PO TABS: 650.0000 mg | ORAL_TABLET | Freq: Four times a day (QID) | ORAL | Status: AC | PRN

## 2015-01-13 MED ORDER — CIPROFLOXACIN HCL 250 MG PO TABS: 250.0000 mg | ORAL_TABLET | Freq: Two times a day (BID) | ORAL | Status: DC

## 2015-01-13 MED ORDER — SODIUM CHLORIDE 0.9 % IV SOLN: INTRAVENOUS | Status: DC

## 2015-01-13 NOTE — Discharge Summary (Signed)
Physician Discharge Summary   Patient ID: Veronica [name] MRN: [id number] DOB/AGE: Dec 17, 1912 81 y.o.  Admit date: 01/10/2015 Discharge date: 01/13/2015  Primary Care Physician:  Kirt Boysarter, Monica, DO  Discharge Diagnoses:    . right Hip fracture [facility](HCC), nondisplaced proximal femur fracture around the previous right hip prosthesis  . Essential hypertension . OAB (overactive bladder) . Depression with anxiety . Scalp laceration . Fall   UTI  Consults:  Orthopedics, Dr Magnus IvanBlackman    Recommendations for Outpatient Follow-up:  Follow urine culture and sensitivities  Fall precaution, orthopedics follow-up in 2 weeks.  Per orthopedics, Dr. Magnus IvanBlackman: limit weightbearing on her right hip for the next 4-6 weeks to allow adequate healing, then support her weight.  TESTS THAT NEED FOLLOW-UP CBC, BMET   DIET: Heart healthy diet    Allergies:  No Known Allergies   Discharge Medications:   Medication List    STOP taking these medications        hydrochlorothiazide 12.5 MG capsule  Commonly known as:  MICROZIDE     Tdap 5-2.5-18.5 LF-MCG/0.5 injection  Commonly known as:  BOOSTRIX     zoster vaccine live (PF) 19400 UNT/0.65ML injection  Commonly known as:  ZOSTAVAX      TAKE these medications        acetaminophen 325 MG tablet  Commonly known as:  TYLENOL  Take 2 tablets (650 mg total) by mouth every 6 (six) hours as needed for mild pain (or Fever >/= 101).     celecoxib 100 MG capsule  Commonly known as:  CELEBREX  Take 1 capsule (100 mg total) by mouth 2 (two) times daily.     ciprofloxacin 250 MG tablet  Commonly known as:  CIPRO  Take 1 tablet (250 mg total) by mouth 2 (two) times daily. X 3 days     CVS SPECTRAVITE ADULT 50+ Tabs  Take 1 tablet by mouth daily.     escitalopram 10 MG tablet  Commonly known as:  LEXAPRO  Take 1 tablet (10 mg total) by mouth daily.     mirtazapine 15 MG tablet  Commonly known as:  REMERON  Take 1 tablet (15 mg  total) by mouth at bedtime.     oxyCODONE 5 MG immediate release tablet  Commonly known as:  Oxy IR/ROXICODONE  Take 1 tablet (5 mg total) by mouth every 6 (six) hours as needed for severe pain.     solifenacin 5 MG tablet  Commonly known as:  VESICARE  Take 1 tablet (5 mg total) by mouth daily.         Brief H and P: For complete details please refer to admission H and P, but in briefVerniece B Fayrene [name] is a [age] y.o. female who suffered a fall when she slid off her bed while getting dressed for bed last night. Her fall was witnessed and she suffered right sided pain and a scalp laceration. In the AM she could not bear weight on her right leg and she was brought to the ED. CT scan of the Hip revealed a small intertrochanteric Fracture of the Right Hip  Hospital Course:  Right Hip fracture [facility](HCC)- currently nonoperative per orthopedics - Orthopedics was consulted, patient was seen by Dr. Magnus IvanBlackman. Per orthopedics recommendations, this is a fracture that can be treated nonoperatively and with time. Implant appears to be well seated without complicating features. Will need to limit weightbearing on her right hip for the next 4-6 weeks to allow adequate healing, then support her weight.  -  PT evaluation was done and recommended skilled nursing facility - Follow up outpatient with Dr. Magnus Ivan in 2 weeks.    Depression with anxiety - Currently stable, continue Lexapro   OAB (overactive bladder) - Continue Vesicare   Hearing loss   Essential hypertension -Currently stable. HCTZ discontinued, BP soft   Scalp laceration, fall - No sutures were needed, Neosporin   History of right shoulder fracture - Pain control as needed  UTI Urine culture pending, placed on ciprofloxacin for 3 days, no acute symptoms  Mild AK I Discontinued HCTZ, patient placed on gentle hydration    Day of Discharge BP 97/41 mmHg  Pulse 50  Temp(Src) 98 F (36.7 C) (Oral)  Resp 18  Ht   (1.626 m)  Wt 49.896 kg (110 lb)  BMI 18.87 kg/m2  SpO2 96%  Physical Exam: General: Alert and awake oriented x3 not in any acute distress, significant hearing deficit. HEENT: anicteric sclera, pupils reactive to light and accommodation CVS: S1-S2 clear no murmur rubs or gallops Chest: clear to auscultation bilaterally, no wheezing rales or rhonchi Abdomen: soft nontender, nondistended, normal bowel sounds Extremities: no cyanosis, clubbing or edema noted bilaterally    The results of significant diagnostics from this hospitalization (including imaging, microbiology, ancillary and laboratory) are listed below for reference.    LAB RESULTS: Basic Metabolic Panel:  Recent Labs Lab 01/12/15 0317 01/13/15 0526  NA 138 137  K 4.4 3.6  CL 102 102  CO2 31 29  GLUCOSE 92 93  BUN 14 16  CREATININE 1.12* 1.30*  CALCIUM 9.7 9.4   Liver Function Tests: No results for input(s): AST, ALT, ALKPHOS, BILITOT, PROT, ALBUMIN in the last 168 hours. No results for input(s): LIPASE, AMYLASE in the last 168 hours. No results for input(s): AMMONIA in the last 168 hours. CBC:  Recent Labs Lab 01/10/15 1351 01/11/15 0259 01/12/15 0317  WBC 7.6 7.2 8.7  NEUTROABS 5.4  --   --   HGB 14.5 12.8 13.6  HCT 43.7 38.6 41.3  MCV 92.2 92.3 92.4  PLT 351 339 318   Cardiac Enzymes: No results for input(s): CKTOTAL, CKMB, CKMBINDEX, TROPONINI in the last 168 hours. BNP: Invalid input(s): POCBNP CBG: No results for input(s): GLUCAP in the last 168 hours.  Significant Diagnostic Studies:  Ct Head Wo Contrast  01/10/2015  CLINICAL DATA:  PT. SLID OFF BED AND HIT HEAD ON TABLE, LAC TO RIGHT OCCIPITAL REGION, LW EXAM: CT HEAD WITHOUT CONTRAST CT CERVICAL SPINE WITHOUT CONTRAST TECHNIQUE: Multidetector CT imaging of the head and cervical spine was performed following the standard protocol without intravenous contrast. Multiplanar CT image reconstructions of the cervical spine were also generated.  COMPARISON:  11/12/2009 FINDINGS: CT HEAD FINDINGS There is significant central and cortical atrophy. Periventricular white matter changes are consistent with small vessel disease. Asymmetry of the lateral ventricles is stable. Bone windows show mucoperiosteal thickening in the right maxillary sinus. No acute calvarial injury. Mastoid air cells are normally aerated. There is atherosclerotic calcification of the internal carotid arteries. CT CERVICAL SPINE FINDINGS There is significant degenerative change within the mid cervical spine, most notably at C5-6, C6-7. There is bilateral foraminal narrowing at C5-6 and C6-7. There is no acute fracture or subluxation. Lung apices are clear. There is significant atherosclerotic calcification of the aortic arch and branch vessels. Carotid arteries are densely calcified. IMPRESSION: 1. Atrophy and small vessel disease. 2.  No evidence for acute intracranial abnormality. 3. Mild chronic sinusitis. 4.  No evidence for  acute cervical spine abnormality. Electronically Signed   By: Norva Pavlov M.D.   On: 01/10/2015 15:08   Ct Cervical Spine Wo Contrast  01/10/2015  CLINICAL DATA:  PT. SLID OFF BED AND HIT HEAD ON TABLE, LAC TO RIGHT OCCIPITAL REGION, LW EXAM: CT HEAD WITHOUT CONTRAST CT CERVICAL SPINE WITHOUT CONTRAST TECHNIQUE: Multidetector CT imaging of the head and cervical spine was performed following the standard protocol without intravenous contrast. Multiplanar CT image reconstructions of the cervical spine were also generated. COMPARISON:  11/12/2009 FINDINGS: CT HEAD FINDINGS There is significant central and cortical atrophy. Periventricular white matter changes are consistent with small vessel disease. Asymmetry of the lateral ventricles is stable. Bone windows show mucoperiosteal thickening in the right maxillary sinus. No acute calvarial injury. Mastoid air cells are normally aerated. There is atherosclerotic calcification of the internal carotid arteries. CT  CERVICAL SPINE FINDINGS There is significant degenerative change within the mid cervical spine, most notably at C5-6, C6-7. There is bilateral foraminal narrowing at C5-6 and C6-7. There is no acute fracture or subluxation. Lung apices are clear. There is significant atherosclerotic calcification of the aortic arch and branch vessels. Carotid arteries are densely calcified. IMPRESSION: 1. Atrophy and small vessel disease. 2.  No evidence for acute intracranial abnormality. 3. Mild chronic sinusitis. 4.  No evidence for acute cervical spine abnormality. Electronically Signed   By: Norva Pavlov M.D.   On: 01/10/2015 15:08   Ct Hip Right Wo Contrast  01/10/2015  CLINICAL DATA:  Right hip pain after fall. EXAM: CT OF THE RIGHT HIP WITHOUT CONTRAST TECHNIQUE: Multidetector CT imaging of the right hip was performed according to the standard protocol. Multiplanar CT image reconstructions were also generated. COMPARISON:  Radiographs from 01/10/2015 FINDINGS: A right hip prosthesis is in place. Streak artifact from the prosthesis mildly obscures surrounding bony structures. There new bony deformities in the right superior and inferior pubic ramus, but these for the most part seem to represent mature callus rather than acute bony discontinuity. They resemble healed fractures rather than acute fractures. These were not present on 11/10/2009. There is some increased irregularity along the greater trochanter cortical margin, for example on image 25 series 6. This is new from 11/10/2009 but also appears to be new bilaterally since that time, raising the likelihood that this is simply degenerative spurring rather than a sign of a fracture. However, on consecutive images 16 through 22 of series 8 there is slight cortical step-off tracking anteriorly along the right proximal femoral metaphysis suspicious for a small periprosthetic fracture. This is nondisplaced and highly subtle. There is a small chance that this could  represent spurring but the location seems atypical. Iliac atherosclerosis noted. IMPRESSION: 1. Suspicion for a small nondisplaced periprosthetic fracture anteriorly along the right femur subtrochanteric region. Admittedly this is a subtle finding and could conceivably be due to spurring. Reference images 16-22 of series 8. 2. Bony deformities in the right pubic rami are new from 2011 but seen to represent mature callus compatible with old pubic ramus fractures. 3. Streak artifact from the implant reduces diagnostic sensitivity/specificity. 4. Iliac atherosclerosis. Electronically Signed   By: Gaylyn Rong M.D.   On: 01/10/2015 18:18   Dg Hip Unilat With Pelvis 2-3 Views Right  01/10/2015  CLINICAL DATA:  Fall last night from pad with right lower extremity pain. Initial encounter. EXAM: DG HIP (WITH OR WITHOUT PELVIS) 2-3V RIGHT COMPARISON:  None. FINDINGS: No acute fracture or dislocation is identified. A bipolar hemiarthroplasty shows normal alignment.  The bony pelvis shows osteopenia. Vascular calcifications are present. Soft tissues are unremarkable. IMPRESSION: No acute fracture identified. A bipolar arthroplasty shows normal alignment. Electronically Signed   By: Irish Lack M.D.   On: 01/10/2015 14:35   Dg Femur, Min 2 Views Right  01/10/2015  CLINICAL DATA:  Fall last night; pt stated she slid from bed and landed right side as she was putting on pajamas. Insists she did not fall but slid. Pain midshaft right femur Hx prior right surgery 2012, cannot recall date of left hip surgery states "years and years" ago EXAM: RIGHT FEMUR 2 VIEWS COMPARISON:  01/10/2015 hip FINDINGS: Patient has had previous right hip arthroplasty. The femur is intact. Bones appear radiolucent. There is dense atherosclerotic calcification of the right femoral and popliteal artery. Degenerative changes are seen at the knee. Evaluation of the knee is degraded by technique. Dedicated knee views are recommended if needed.  IMPRESSION: No evidence for acute fracture of the femur. Electronically Signed   By: Norva Pavlov M.D.   On: 01/10/2015 14:33    2D ECHO:   Disposition and Follow-up:     Discharge Instructions    Diet - low sodium heart healthy    Complete by:  As directed      Increase activity slowly    Complete by:  As directed      Non weight bearing    Complete by:  As directed   Laterality:  right  Extremity:  Lower            DISPOSITION: Skilled nursing facility   DISCHARGE FOLLOW-UP Follow-up Information    Follow up with Kathryne Hitch, MD. Schedule an appointment as soon as possible for a visit in 2 weeks.   Specialty:  Orthopedic Surgery   Contact information:   [address] [address] [place] [address] [phone]        Time spent on Discharge: 35 minutes  Signed:   Saki Legore M.D. [facility] 01/13/2015, 10:46 AM Pager: [phone]

## 2015-01-13 NOTE — Clinical Social Work Note (Signed)
[facility]  Patient and family agreeable to plans will transport via son's car RN to call report to [phone].  Windell MouldingEric Martin Smeal, MSW, Theresia MajorsLCSWA [phone]

## 2015-01-13 NOTE — Progress Notes (Signed)
Called [facility] to give report to [place] the Supervisor

## 2015-01-13 NOTE — Progress Notes (Signed)
Physical Therapy Treatment Patient Details Name: Veronica NidaVerniece B Barnhardt MRN: [id number] DOB: 10/22/1912 Today's Date: 01/13/2015    History of Present Illness Veronica [name] is a [age] y.o. female who suffered a fall when she slid off her bed while getting dressed for bed last night. Her fall was witnessed and she suffered right sided pain and a scalp laceration. In the AM she could not bear weight on her right leg and she was brought to the ED. CT scan of the Hip revealed a small intertrochanteric Fracture of the Right Hip; Opted for conservative management, will be NWB Right LE for 4-6 weeks    PT Comments    Session focused on teaching lateral scoot transfers as a safe option for transfers with NWB status RLE; [facility] cues and mod assist; Continuing to need close monitor and assist for NWB RLE; Overall progressing well; Anticipate continuing good progress at post-acute rehabilitation.   Follow Up Recommendations  SNF     Equipment Recommendations  Rolling walker with 5" wheels;Wheelchair (measurements PT);Wheelchair cushion (measurements PT) (drop-arm bedside commode)    Recommendations for Other Services       Precautions / Restrictions Precautions Precautions: Fall Restrictions Weight Bearing Restrictions: Yes RLE Weight Bearing: Non weight bearing    Mobility  Bed Mobility Overal bed mobility: Needs Assistance Bed Mobility: Supine to Sit     Supine to sit: Min assist     General bed mobility comments: min handheld assist to pull to sit; minimal pain with bed mobility  Transfers Overall transfer level: Needs assistance Equipment used: 1 person hand held assist Transfers: Lateral/Scoot Transfers          Lateral/Scoot Transfers: Mod assist;+2 physical assistance General transfer comment: Scoot pivot transfer with 1 person hand held asssist and 1 person to maintain on RLE. Scoot pivot from EOB to drop arm BSC, drop arm BSC to chair     Ambulation/Gait             General Gait Details: Held amb for today due to difficulty keeping NWB RLE   Stairs            Wheelchair Mobility    Modified Rankin (Stroke Patients Only)       Balance Overall balance assessment: Needs assistance Sitting-balance support: Bilateral upper extremity supported Sitting balance-Leahy Scale: Good                              Cognition Arousal/Alertness: Awake/alert Behavior During Therapy: WFL for tasks assessed/performed Overall Cognitive Status: Within Functional Limits for tasks assessed                      Exercises      General Comments        Pertinent Vitals/Pain Pain Assessment: No/denies pain    Home Living Family/patient expects to be discharged to:: Skilled nursing facility                    Prior Function Level of Independence: Independent          PT Goals (current goals can now be found in the care plan section) Acute Rehab PT Goals Patient Stated Goal: none stated PT Goal Formulation: With patient Time For Goal Achievement: 01/26/15 Potential to Achieve Goals: Good Progress towards PT goals: Progressing toward goals    Frequency  Min 3X/week    PT Plan Current  plan remains appropriate;Frequency needs to be updated    Co-evaluation PT/OT/SLP Co-Evaluation/Treatment: Yes Reason for Co-Treatment: For patient/therapist safety (one person dedicated to keeping NWB) PT goals addressed during session: Mobility/safety with mobility;Other (comment) (NWB status RLE) OT goals addressed during session: ADL's and self-care     End of Session   Activity Tolerance: Patient tolerated treatment well Patient left: in chair;with call bell/phone within reach;with chair alarm set     Time: [phone] PT Time Calculation (min) (ACUTE ONLY): 18 min  Charges:  $Therapeutic Activity: 8-22 mins                    G Codes:  Functional Assessment Tool Used: Clinical  Judgement Functional Limitation: Mobility: Walking and moving around Mobility: Walking and Moving Around Goal Status (V4098): 0 percent impaired, limited or restricted Mobility: Walking and Moving Around Discharge Status [id number]): At least 20 percent but less than 40 percent impaired, limited or restricted   Van Clines Hamff 01/13/2015, 1:24 PM  Van Clines, [place]  Acute Rehabilitation Services Pager [phone] Office [phone]

## 2015-01-13 NOTE — Discharge Instructions (Signed)
No weight on right hip until further notice

## 2015-01-13 NOTE — Care Management Obs Status (Signed)
  Patient Details  Name: Veronica [name] MRN: [id number] Date of Birth: [birth year]  Medicare Observation Status Notification Given:      Patient changed from inpatient to obsveration 11/14/16MEDICARE OBSERVATION STATUS NOTIFICATION        Durenda GuthrieBrady, Ivorie Uplinger Naomi, RN 01/13/2015, 10:39 AM

## 2015-01-13 NOTE — Evaluation (Signed)
Occupational Therapy Evaluation Patient Details Name: Veronica NidaVerniece B Mayhan MRN: [id number] DOB: 01/09/1913 Today's Date: 01/13/2015    History of Present Illness Veronica [name] is a [age] y.o. female who suffered a fall when she slid off her bed while getting dressed for bed last night. Her fall was witnessed and she suffered right sided pain and a scalp laceration. In the AM she could not bear weight on her right leg and she was brought to the ED. CT scan of the Hip revealed a small intertrochanteric Fracture of the Right Hip; Opted for conservative management, will be NWB Right LE for 4-6 weeks   Clinical Impression   Pt reports she was independent with ADLs PTA. Began ADL and safety education. Pt currently mod +2 for scoot pivot transfer [facility]. +2 required for safety and to maintain NWB status. Recommending SNF for further rehab prior to returning home in order to maximize independence and safety with ADLs and mobility. Pt would benefit from continued skilled OT in order to increase independence with LB ADLs and functional transfers.    Follow Up Recommendations  SNF;Supervision/Assistance - 24 hour    Equipment Recommendations  Other (comment) (TBD at next venue )    Recommendations for Other Services       Precautions / Restrictions Precautions Precautions: Fall Restrictions Weight Bearing Restrictions: Yes RLE Weight Bearing: Non weight bearing      Mobility Bed Mobility Overal bed mobility: Needs Assistance Bed Mobility: Supine to Sit     Supine to sit: Min assist        Transfers Overall transfer level: Needs assistance Equipment used: 1 person hand held assist Transfers: Lateral/Scoot Transfers          Lateral/Scoot Transfers: Mod assist;+2 physical assistance General transfer comment: Scoot pivot transfer with 1 person hand held asssist and 1 person to maintain on RLE. Scoot pivot from EOB to drop arm BSC, drop arm BSC to chair      Balance  Overall balance assessment: Needs assistance Sitting-balance support: Bilateral upper extremity supported Sitting balance-Leahy Scale: Good                                      ADL Overall ADL's : Needs assistance/impaired Eating/Feeding: Set up;Sitting   Grooming: Set up;Sitting       Lower Body Bathing: Moderate assistance;Sitting/lateral leans       Lower Body Dressing: Moderate assistance;Sitting/lateral leans   Toilet Transfer: Moderate assistance;+2 for physical assistance;BSC;Requires drop arm (scoot pivot ) Toilet Transfer Details (indicate cue type and reason): +2 needed for pt to maintain NWB on RLE and mod A to boost [facility]           General ADL Comments: No family present for OT eval. Began education on safety during functional transfers and NWB status; pt verbalized understanding. Pt able to teach back precautions at end of session.     Vision     Perception     Praxis      Pertinent Vitals/Pain Pain Assessment: No/denies pain     Hand Dominance     Extremity/Trunk Assessment Upper Extremity Assessment Upper Extremity Assessment: Generalized weakness   Lower Extremity Assessment Lower Extremity Assessment: Defer to PT evaluation   Cervical / Trunk Assessment Cervical / Trunk Assessment: Normal   Communication Communication Communication: HOH   Cognition Arousal/Alertness: Awake/alert Behavior During Therapy: [facility]  for tasks assessed/performed Overall Cognitive Status: Within Functional Limits for tasks assessed                     General Comments       Exercises       Shoulder Instructions      Home Living Family/patient expects to be discharged to:: Skilled nursing facility                                        Prior Functioning/Environment Level of Independence: Independent             OT Diagnosis: Generalized weakness;Acute pain   OT Problem List: Decreased  strength;Decreased activity tolerance;Impaired balance (sitting and/or standing);Decreased safety awareness;Decreased knowledge of use of DME or AE;Decreased knowledge of precautions;Pain   OT Treatment/Interventions: Self-care/ADL training;Therapeutic exercise;DME and/or AE instruction;Patient/family education    OT Goals(Current goals can be found in the care plan section) Acute Rehab OT Goals Patient Stated Goal: none stated OT Goal Formulation: With patient Time For Goal Achievement: 01/27/15 Potential to Achieve Goals: Good ADL Goals Pt Will Perform Grooming: standing;with mod assist Pt Will Perform Lower Body Bathing: with min assist;sit to/from stand (with or without AE) Pt Will Perform Lower Body Dressing: with min assist;sit to/from stand (with or without AE) Pt Will Transfer to Toilet: with mod assist;bedside commode (scoot pivot ) Pt Will Perform Toileting - Clothing Manipulation and hygiene: with mod assist;sit to/from stand Pt/caregiver will Perform Home Exercise Program: Increased strength;Both right and left upper extremity;With theraband;Independently;With written HEP provided  OT Frequency: Min 2X/week   Barriers to D/C:            Co-evaluation PT/OT/SLP Co-Evaluation/Treatment: Yes Reason for Co-Treatment: For patient/therapist safety   OT goals addressed during session: ADL's and self-care      End of Session    Activity Tolerance: Patient tolerated treatment well Patient left: in chair;with call bell/phone within reach;with chair alarm set   Time: 1610-9604 OT Time Calculation (min): 18 min Charges:  OT General Charges $OT Visit: 1 Procedure OT Evaluation $Initial OT Evaluation Tier I: 1 Procedure G-Codes: OT G-codes **NOT FOR INPATIENT CLASS** Functional Assessment Tool Used: Clinical judgement Functional Limitation: Self care Self Care Current Status (V4098): At least 40 percent but less than 60 percent impaired, limited or restricted Self Care  Goal Status (J1914): At least 20 percent but less than 40 percent impaired, limited or restricted   Gaye Alken M.S., OTR/L Pager: [phone]  01/13/2015, 1:04 PM

## 2015-01-14 ENCOUNTER — Non-Acute Institutional Stay (SKILLED_NURSING_FACILITY): Payer: Medicare Other | Admitting: Adult Health

## 2015-01-14 ENCOUNTER — Encounter: Payer: Self-pay | Admitting: Adult Health

## 2015-01-14 DIAGNOSIS — N390 Urinary tract infection, site not specified: Secondary | ICD-10-CM

## 2015-01-14 DIAGNOSIS — S72001S Fracture of unspecified part of neck of right femur, sequela: Secondary | ICD-10-CM

## 2015-01-14 DIAGNOSIS — F32A Depression, unspecified: Secondary | ICD-10-CM

## 2015-01-14 DIAGNOSIS — N3281 Overactive bladder: Secondary | ICD-10-CM | POA: Diagnosis not present

## 2015-01-14 DIAGNOSIS — F329 Major depressive disorder, single episode, unspecified: Secondary | ICD-10-CM

## 2015-01-14 DIAGNOSIS — I10 Essential (primary) hypertension: Secondary | ICD-10-CM | POA: Diagnosis not present

## 2015-01-14 DIAGNOSIS — M129 Arthropathy, unspecified: Secondary | ICD-10-CM | POA: Diagnosis not present

## 2015-01-14 DIAGNOSIS — H9190 Unspecified hearing loss, unspecified ear: Secondary | ICD-10-CM

## 2015-01-14 LAB — URINE CULTURE

## 2015-01-15 NOTE — Progress Notes (Signed)
Patient ID: Veronica [name], female   DOB: April 30, 1912, 81 y.o.   MRN: [id number]    DATE:  01/14/15  MRN:  [id number]  BIRTHDAY: April 30, 1912  Facility:  Nursing Home Location:  [facility]  Nursing Home Room Number: [id number]  LEVEL OF CARE:  SNF [phone])  Contact Information    Name Relation Home Work Mobile   [place],Michael Son [phone]  [phone]       Chief Complaint  Patient presents with  . Hospitalization Follow-up    Right hip fracture, hypertension, overactive bladder, depression, arthritis, UTI and heating loss    HISTORY OF PRESENT ILLNESS:  This is a 81 year old female who [facility] She has PMH of recurrent vertebral fracture, anxiety and shingles. She fell at home sustaining a right hip fracture, nondisplaced proximal femur fracture around the prosthesis. Orthopedic consultation done and determined that fracture can be treated non-operatively and with time. She is limited weightbearing on her right hip for the next 4-6 weeks to allow adequate healing.  She has been admitted for a short-term rehabilitation.  PAST MEDICAL HISTORY:  Past Medical History  Diagnosis Date  . Hx of recurrent vertebral fractures   . Anxiety   . History of shingles     severe; abdominal     CURRENT MEDICATIONS: Reviewed  Patient's Medications  New Prescriptions   No medications on file  Previous Medications   ACETAMINOPHEN (TYLENOL) 325 MG TABLET    Take 2 tablets (650 mg total) by mouth every 6 (six) hours as needed for mild pain (or Fever >/= 101).   CELECOXIB (CELEBREX) 100 MG CAPSULE    Take 1 capsule (100 mg total) by mouth 2 (two) times daily.   CIPROFLOXACIN (CIPRO) 250 MG TABLET    Take 1 tablet (250 mg total) by mouth 2 (two) times daily. X 3 days   ESCITALOPRAM (LEXAPRO) 10 MG TABLET    Take 1 tablet (10 mg total) by mouth daily.   MIRTAZAPINE (REMERON) 15 MG TABLET    Take 1 tablet (15 mg  total) by mouth at bedtime.   MULTIPLE VITAMINS-MINERALS ([employer] SPECTRAVITE ADULT 50+) TABS    Take 1 tablet by mouth daily.   OXYCODONE (OXY IR/ROXICODONE) 5 MG IMMEDIATE RELEASE TABLET    Take 1 tablet (5 mg total) by mouth every 6 (six) hours as needed for severe pain.   SOLIFENACIN (VESICARE) 5 MG TABLET    Take 1 tablet (5 mg total) by mouth daily.  Modified Medications   No medications on file  Discontinued Medications   No medications on file    No Known Allergies   REVIEW OF SYSTEMS:  GENERAL: no change in appetite, no fatigue, no weight changes, no fever, chills or weakness EYES: Denies change in vision, dry eyes, eye pain, itching or discharge EARS: Denies change in hearing, ringing in ears, or earache NOSE: Denies nasal congestion or epistaxis MOUTH and THROAT: Denies oral discomfort, gingival pain or bleeding, pain from teeth or hoarseness   RESPIRATORY: no cough, SOB, DOE, wheezing, hemoptysis CARDIAC: no chest pain, edema or palpitations GI: no abdominal pain, diarrhea, constipation, heart burn, nausea or vomiting GU: Denies dysuria, frequency, hematuria, incontinence, or discharge PSYCHIATRIC: Denies feeling of depression or anxiety. No report of hallucinations, insomnia, paranoia, or agitation   PHYSICAL EXAMINATION  GENERAL APPEARANCE: Well nourished. In no acute distress. Normal body habitus HEAD: Normal in size and contour. No evidence of trauma  EYES: Lids open and close normally. No blepharitis, entropion or ectropion. PERRL. Conjunctivae are clear and sclerae are white. Lenses are without opacity EARS: Pinnae are normal. Patient hears normal voice tunes of the examiner MOUTH and THROAT: Lips are without lesions. Oral mucosa is moist and without lesions. Tongue is normal in shape, size, and color and without lesions NECK: supple, trachea midline, no neck masses, no thyroid tenderness, no thyromegaly LYMPHATICS: no LAN in the neck, no supraclavicular  LAN RESPIRATORY: breathing is even & unlabored, BS CTAB CARDIAC: RRR, no murmur,no extra heart sounds, no edema GI: abdomen soft, normal BS, no masses, no tenderness, no hepatomegaly, no splenomegaly EXTREMITIES:  Able to move 4 extremities; limited ROM on RLE due to pain PSYCHIATRIC: Alert and oriented X 3. Affect and behavior are appropriate  LABS/RADIOLOGY: Labs reviewed: Basic Metabolic Panel:  Recent Labs  32/44/01 0259 01/12/15 0317 01/13/15 0526  NA 138 138 137  K 3.3* 4.4 3.6  CL 102 102 102  CO2 GLUCOSE 83 92 93  BUN CREATININE 1.08* 1.12* 1.30*  CALCIUM 9.3 9.7 9.4   Liver Function Tests:  Recent Labs  05/02/14 1544 08/22/14 1023  AST 32 21  ALT 19 12  ALKPHOS 130* 105  BILITOT 0.7 0.6  PROT 7.1 6.6  ALBUMIN 4.2 4.0   CBC:  Recent Labs  05/02/14 1544 01/10/15 1351 01/11/15 0259 01/12/15 0317  WBC 7.8 7.6 7.2 8.7  NEUTROABS 5.6 5.4  --   --   HGB 14.9 14.5 12.8 13.6  HCT 44.3 43.7 38.6 41.3  MCV 91 92.2 92.3 92.4  PLT 319 351 339 318     Ct Head Wo Contrast  01/10/2015  CLINICAL DATA:  PT. SLID OFF BED AND HIT HEAD ON TABLE, LAC TO RIGHT OCCIPITAL REGION, LW EXAM: CT HEAD WITHOUT CONTRAST CT CERVICAL SPINE WITHOUT CONTRAST TECHNIQUE: Multidetector CT imaging of the head and cervical spine was performed following the standard protocol without intravenous contrast. Multiplanar CT image reconstructions of the cervical spine were also generated. COMPARISON:  11/12/2009 FINDINGS: CT HEAD FINDINGS There is significant central and cortical atrophy. Periventricular white matter changes are consistent with small vessel disease. Asymmetry of the lateral ventricles is stable. Bone windows show mucoperiosteal thickening in the right maxillary sinus. No acute calvarial injury. Mastoid air cells are normally aerated. There is atherosclerotic calcification of the internal carotid arteries. CT CERVICAL SPINE FINDINGS There is significant  degenerative change within the mid cervical spine, most notably at C5-6, C6-7. There is bilateral foraminal narrowing at C5-6 and C6-7. There is no acute fracture or subluxation. Lung apices are clear. There is significant atherosclerotic calcification of the aortic arch and branch vessels. Carotid arteries are densely calcified. IMPRESSION: 1. Atrophy and small vessel disease. 2.  No evidence for acute intracranial abnormality. 3. Mild chronic sinusitis. 4.  No evidence for acute cervical spine abnormality. Electronically Signed   By: Norva Pavlov M.D.   On: 01/10/2015 15:08   Ct Cervical Spine Wo Contrast  01/10/2015  CLINICAL DATA:  PT. SLID OFF BED AND HIT HEAD ON TABLE, LAC TO RIGHT OCCIPITAL REGION, LW EXAM: CT HEAD WITHOUT CONTRAST CT CERVICAL SPINE WITHOUT CONTRAST TECHNIQUE: Multidetector CT imaging of the head and cervical spine was performed following the standard protocol without intravenous contrast. Multiplanar CT image reconstructions of the cervical spine were also generated. COMPARISON:  11/12/2009 FINDINGS: CT HEAD FINDINGS There is significant central and cortical atrophy. Periventricular white matter changes  are consistent with small vessel disease. Asymmetry of the lateral ventricles is stable. Bone windows show mucoperiosteal thickening in the right maxillary sinus. No acute calvarial injury. Mastoid air cells are normally aerated. There is atherosclerotic calcification of the internal carotid arteries. CT CERVICAL SPINE FINDINGS There is significant degenerative change within the mid cervical spine, most notably at C5-6, C6-7. There is bilateral foraminal narrowing at C5-6 and C6-7. There is no acute fracture or subluxation. Lung apices are clear. There is significant atherosclerotic calcification of the aortic arch and branch vessels. Carotid arteries are densely calcified. IMPRESSION: 1. Atrophy and small vessel disease. 2.  No evidence for acute intracranial abnormality. 3. Mild  chronic sinusitis. 4.  No evidence for acute cervical spine abnormality. Electronically Signed   By: Norva Pavlov M.D.   On: 01/10/2015 15:08   Ct Hip Right Wo Contrast  01/10/2015  CLINICAL DATA:  Right hip pain after fall. EXAM: CT OF THE RIGHT HIP WITHOUT CONTRAST TECHNIQUE: Multidetector CT imaging of the right hip was performed according to the standard protocol. Multiplanar CT image reconstructions were also generated. COMPARISON:  Radiographs from 01/10/2015 FINDINGS: A right hip prosthesis is in place. Streak artifact from the prosthesis mildly obscures surrounding bony structures. There new bony deformities in the right superior and inferior pubic ramus, but these for the most part seem to represent mature callus rather than acute bony discontinuity. They resemble healed fractures rather than acute fractures. These were not present on 11/10/2009. There is some increased irregularity along the greater trochanter cortical margin, for example on image 25 series 6. This is new from 11/10/2009 but also appears to be new bilaterally since that time, raising the likelihood that this is simply degenerative spurring rather than a sign of a fracture. However, on consecutive images 16 through 22 of series 8 there is slight cortical step-off tracking anteriorly along the right proximal femoral metaphysis suspicious for a small periprosthetic fracture. This is nondisplaced and highly subtle. There is a small chance that this could represent spurring but the location seems atypical. Iliac atherosclerosis noted. IMPRESSION: 1. Suspicion for a small nondisplaced periprosthetic fracture anteriorly along the right femur subtrochanteric region. Admittedly this is a subtle finding and could conceivably be due to spurring. Reference images 16-22 of series 8. 2. Bony deformities in the right pubic rami are new from 2011 but seen to represent mature callus compatible with old pubic ramus fractures. 3. Streak artifact from  the implant reduces diagnostic sensitivity/specificity. 4. Iliac atherosclerosis. Electronically Signed   By: Gaylyn Rong M.D.   On: 01/10/2015 18:18   Dg Hip Unilat With Pelvis 2-3 Views Right  01/10/2015  CLINICAL DATA:  Fall last night from pad with right lower extremity pain. Initial encounter. EXAM: DG HIP (WITH OR WITHOUT PELVIS) 2-3V RIGHT COMPARISON:  None. FINDINGS: No acute fracture or dislocation is identified. A bipolar hemiarthroplasty shows normal alignment. The bony pelvis shows osteopenia. Vascular calcifications are present. Soft tissues are unremarkable. IMPRESSION: No acute fracture identified. A bipolar arthroplasty shows normal alignment. Electronically Signed   By: Irish Lack M.D.   On: 01/10/2015 14:35   Dg Femur, Min 2 Views Right  01/10/2015  CLINICAL DATA:  Fall last night; pt stated she slid from bed and landed right side as she was putting on pajamas. Insists she did not fall but slid. Pain midshaft right femur Hx prior right surgery 2012, cannot recall date of left hip surgery states "years and years" ago EXAM: RIGHT FEMUR 2 VIEWS  COMPARISON:  01/10/2015 hip FINDINGS: Patient has had previous right hip arthroplasty. The femur is intact. Bones appear radiolucent. There is dense atherosclerotic calcification of the right femoral and popliteal artery. Degenerative changes are seen at the knee. Evaluation of the knee is degraded by technique. Dedicated knee views are recommended if needed. IMPRESSION: No evidence for acute fracture of the femur. Electronically Signed   By: Norva Pavlov M.D.   On: 01/10/2015 14:33    ASSESSMENT/PLAN:  Proximal femur fracture - nonoperative management; limit weightbearing on her right hip 4-6 weeks; continue acetaminophen 325 mg 2 tabs by mouth every 6 hours when necessary and oxycodone 5 mg 1 tab by mouth every 6 hours when necessary for pain; follow-up with Dr. Magnus Ivan, orthopedic surgeon, in 2 weeks  Hypertension - HCTZ was  recently discontinued due to soft beat the; BP/heart rate twice a day 1 week; check CMP  Overactive bladder - continue Vesicare 5 mg 1 tab by mouth daily  Depression - mood this is stable; continue Lexapro 10 mg 1 tab by mouth daily and Remeron 15 mg 1 tab by mouth daily at bedtime  Arthritis - continue Celebrex 100 mg 1 capsule by mouth twice a day  UTI - continue ciprofloxacin 250 mg 1 tab by mouth twice a day 3 days; check CBC  Hearing loss - continue use of the bilateral hearing aid     Goals of care:  Short-term rehabilitation    [facility], NP [facility] [phone]

## 2015-01-16 ENCOUNTER — Non-Acute Institutional Stay (SKILLED_NURSING_FACILITY): Payer: Medicare Other | Admitting: Internal Medicine

## 2015-01-16 DIAGNOSIS — Z9181 History of falling: Secondary | ICD-10-CM

## 2015-01-16 DIAGNOSIS — F329 Major depressive disorder, single episode, unspecified: Secondary | ICD-10-CM

## 2015-01-16 DIAGNOSIS — I10 Essential (primary) hypertension: Secondary | ICD-10-CM | POA: Diagnosis not present

## 2015-01-16 DIAGNOSIS — N289 Disorder of kidney and ureter, unspecified: Secondary | ICD-10-CM

## 2015-01-16 DIAGNOSIS — N3281 Overactive bladder: Secondary | ICD-10-CM

## 2015-01-16 DIAGNOSIS — S0101XS Laceration without foreign body of scalp, sequela: Secondary | ICD-10-CM

## 2015-01-16 DIAGNOSIS — N3000 Acute cystitis without hematuria: Secondary | ICD-10-CM

## 2015-01-16 DIAGNOSIS — S72001S Fracture of unspecified part of neck of right femur, sequela: Secondary | ICD-10-CM

## 2015-01-16 DIAGNOSIS — H04121 Dry eye syndrome of right lacrimal gland: Secondary | ICD-10-CM

## 2015-01-16 DIAGNOSIS — R2681 Unsteadiness on feet: Secondary | ICD-10-CM | POA: Diagnosis not present

## 2015-01-16 NOTE — Progress Notes (Signed)
Patient ID: [name], female   DOB: 11-16-1912, 81 y.o.   MRN: [id number]      [facility]   PCP: Kirt Boys, DO  Code Status: DNR  No Known Allergies  Chief Complaint  Patient presents with  . New Admit To SNF     HPI:  82 y.o. patient is here for short term rehabilitation post hospital admission from 01/10/15-01/13/15 post fall with right non displaced proximal femur fracture around her prosthesis. She was seen by orthopedic and conservative management recommended. She was also started on treatment for UTI. Her HCTZ was held in hospital with her acute renal impairment. She is seen in her room today. Her pain is under control with current regimen. She denies any concerns. She has PMH of recurrent vertebral fracture, HTN, hearing loss.    Review of Systems:  Constitutional: Negative for fever, chills, diaphoresis.  HENT: Negative for headache, congestion, nasal discharge. Eyes: Negative for eye pain, blurred vision, double vision.  Respiratory: Negative for cough, shortness of breath and wheezing.   Cardiovascular: Negative for chest pain, palpitations, leg swelling.  Gastrointestinal: Negative for heartburn, nausea, vomiting, abdominal pain. Bowel movement yesterday Genitourinary: Negative for dysuria, flank pain.  Musculoskeletal: Negative for back pain, falls in the facility Skin: Negative for itching, rash.  Neurological: Negative for dizziness, tingling, focal weakness Psychiatric/Behavioral: Negative for depression   Past Medical History  Diagnosis Date  . Hx of recurrent vertebral fractures   . Anxiety   . History of shingles     severe; abdominal   Past Surgical History  Procedure Laterality Date  . Eye surgery      cataracts  . Fracture surgery Right 2012    Scott Dean; hip repair  . Fracture surgery Right 2011    shoulder shattered due to fall   Social History:   reports that she has never smoked. She has  never used smokeless tobacco. She reports that she does not drink alcohol or use illicit drugs.  Family History  Problem Relation Age of Onset  . Stroke Mother   . Stroke Father   . COPD Sister     Medications:   Medication List       This list is accurate as of: 01/16/15  2:47 PM.  Always use your most recent med list.               acetaminophen 325 MG tablet  Commonly known as:  TYLENOL  Take 2 tablets (650 mg total) by mouth every 6 (six) hours as needed for mild pain (or Fever >/= 101).     celecoxib 100 MG capsule  Commonly known as:  CELEBREX  Take 1 capsule (100 mg total) by mouth 2 (two) times daily.     ciprofloxacin 250 MG tablet  Commonly known as:  CIPRO  Take 1 tablet (250 mg total) by mouth 2 (two) times daily. X 3 days     [employer] SPECTRAVITE ADULT 50+ Tabs  Take 1 tablet by mouth daily.     escitalopram 10 MG tablet  Commonly known as:  LEXAPRO  Take 1 tablet (10 mg total) by mouth daily.     mirtazapine 15 MG tablet  Commonly known as:  REMERON  Take 1 tablet (15 mg total) by mouth at bedtime.     oxyCODONE 5 MG immediate release tablet  Commonly known as:  Oxy IR/ROXICODONE  Take 1 tablet (5 mg total) by mouth every 6 (six) hours  as needed for severe pain.     solifenacin 5 MG tablet  Commonly known as:  VESICARE  Take 1 tablet (5 mg total) by mouth daily.         Physical Exam: Filed Vitals:   01/16/15 1446  BP: 135/62  Pulse: 60  Temp: 97.6 F (36.4 C)  Resp: 18  SpO2: 97%    General- elderly female, frail, in no acute distress, hard of hearing Head- normocephalic, atraumatic, scab on her right scalp Nose- normal nasal mucosa, no maxillary or frontal sinus tenderness, no nasal discharge Throat- moist mucus membrane  Eyes- PERRLA, EOMI, no pallor, no icterus, mild erythema to right conjunctiva, no drainage Neck- no cervical lymphadenopathy Cardiovascular- normal s1,s2, no murmurs, palpable dorsalis pedis and radial pulses, no leg  edema Respiratory- bilateral clear to auscultation, no wheeze, no rhonchi, no crackles, no use of accessory muscles Abdomen- bowel sounds present, soft, non tender Musculoskeletal- able to move all 4 extremities, right leg ROM limited, unsteady gait  Neurological- no focal deficit, alert and oriented to person, place and time Skin- warm and dry Psychiatry- normal mood and affect    Labs reviewed: Basic Metabolic Panel:  Recent Labs  16/10/96 0259 01/12/15 0317 01/13/15 0526  NA 138 138 137  K 3.3* 4.4 3.6  CL 102 102 102  CO2 29 31 29   GLUCOSE 83 92 93  BUN 12 14 16   CREATININE 1.08* 1.12* 1.30*  CALCIUM 9.3 9.7 9.4   Liver Function Tests:  Recent Labs  05/02/14 1544 08/22/14 1023  AST 32 21  ALT 19 12  ALKPHOS 130* 105  BILITOT 0.7 0.6  PROT 7.1 6.6  ALBUMIN 4.2 4.0   No results for input(s): LIPASE, AMYLASE in the last 8760 hours. No results for input(s): AMMONIA in the last 8760 hours. CBC:  Recent Labs  05/02/14 1544 01/10/15 1351 01/11/15 0259 01/12/15 0317  WBC 7.8 7.6 7.2 8.7  NEUTROABS 5.6 5.4  --   --   HGB 14.9 14.5 12.8 13.6  HCT 44.3 43.7 38.6 41.3  MCV 91 92.2 92.3 92.4  PLT 319 351 339 318    Radiological Exams: Ct Head Wo Contrast  01/10/2015  CLINICAL DATA:  PT. SLID OFF BED AND HIT HEAD ON TABLE, LAC TO RIGHT OCCIPITAL REGION, LW EXAM: CT HEAD WITHOUT CONTRAST CT CERVICAL SPINE WITHOUT CONTRAST TECHNIQUE: Multidetector CT imaging of the head and cervical spine was performed following the standard protocol without intravenous contrast. Multiplanar CT image reconstructions of the cervical spine were also generated. COMPARISON:  11/12/2009 FINDINGS: CT HEAD FINDINGS There is significant central and cortical atrophy. Periventricular white matter changes are consistent with small vessel disease. Asymmetry of the lateral ventricles is stable. Bone windows show mucoperiosteal thickening in the right maxillary sinus. No acute calvarial injury.  Mastoid air cells are normally aerated. There is atherosclerotic calcification of the internal carotid arteries. CT CERVICAL SPINE FINDINGS There is significant degenerative change within the mid cervical spine, most notably at C5-6, C6-7. There is bilateral foraminal narrowing at C5-6 and C6-7. There is no acute fracture or subluxation. Lung apices are clear. There is significant atherosclerotic calcification of the aortic arch and branch vessels. Carotid arteries are densely calcified. IMPRESSION: 1. Atrophy and small vessel disease. 2.  No evidence for acute intracranial abnormality. 3. Mild chronic sinusitis. 4.  No evidence for acute cervical spine abnormality. Electronically Signed   By: Norva Pavlov M.D.   On: 01/10/2015 15:08   Ct Cervical Spine Wo  Contrast  01/10/2015  CLINICAL DATA:  PT. SLID OFF BED AND HIT HEAD ON TABLE, LAC TO RIGHT OCCIPITAL REGION, LW EXAM: CT HEAD WITHOUT CONTRAST CT CERVICAL SPINE WITHOUT CONTRAST TECHNIQUE: Multidetector CT imaging of the head and cervical spine was performed following the standard protocol without intravenous contrast. Multiplanar CT image reconstructions of the cervical spine were also generated. COMPARISON:  11/12/2009 FINDINGS: CT HEAD FINDINGS There is significant central and cortical atrophy. Periventricular white matter changes are consistent with small vessel disease. Asymmetry of the lateral ventricles is stable. Bone windows show mucoperiosteal thickening in the right maxillary sinus. No acute calvarial injury. Mastoid air cells are normally aerated. There is atherosclerotic calcification of the internal carotid arteries. CT CERVICAL SPINE FINDINGS There is significant degenerative change within the mid cervical spine, most notably at C5-6, C6-7. There is bilateral foraminal narrowing at C5-6 and C6-7. There is no acute fracture or subluxation. Lung apices are clear. There is significant atherosclerotic calcification of the aortic arch and branch  vessels. Carotid arteries are densely calcified. IMPRESSION: 1. Atrophy and small vessel disease. 2.  No evidence for acute intracranial abnormality. 3. Mild chronic sinusitis. 4.  No evidence for acute cervical spine abnormality. Electronically Signed   By: Norva PavlovElizabeth  Brown M.D.   On: 01/10/2015 15:08   Ct Hip Right Wo Contrast  01/10/2015  CLINICAL DATA:  Right hip pain after fall. EXAM: CT OF THE RIGHT HIP WITHOUT CONTRAST TECHNIQUE: Multidetector CT imaging of the right hip was performed according to the standard protocol. Multiplanar CT image reconstructions were also generated. COMPARISON:  Radiographs from 01/10/2015 FINDINGS: A right hip prosthesis is in place. Streak artifact from the prosthesis mildly obscures surrounding bony structures. There new bony deformities in the right superior and inferior pubic ramus, but these for the most part seem to represent mature callus rather than acute bony discontinuity. They resemble healed fractures rather than acute fractures. These were not present on 11/10/2009. There is some increased irregularity along the greater trochanter cortical margin, for example on image 25 series 6. This is new from 11/10/2009 but also appears to be new bilaterally since that time, raising the likelihood that this is simply degenerative spurring rather than a sign of a fracture. However, on consecutive images 16 through 22 of series 8 there is slight cortical step-off tracking anteriorly along the right proximal femoral metaphysis suspicious for a small periprosthetic fracture. This is nondisplaced and highly subtle. There is a small chance that this could represent spurring but the location seems atypical. Iliac atherosclerosis noted. IMPRESSION: 1. Suspicion for a small nondisplaced periprosthetic fracture anteriorly along the right femur subtrochanteric region. Admittedly this is a subtle finding and could conceivably be due to spurring. Reference images 16-22 of series 8. 2.  Bony deformities in the right pubic rami are new from 2011 but seen to represent mature callus compatible with old pubic ramus fractures. 3. Streak artifact from the implant reduces diagnostic sensitivity/specificity. 4. Iliac atherosclerosis. Electronically Signed   By: Gaylyn RongWalter  Liebkemann M.D.   On: 01/10/2015 18:18   Dg Hip Unilat With Pelvis 2-3 Views Right  01/10/2015  CLINICAL DATA:  Fall last night from pad with right lower extremity pain. Initial encounter. EXAM: DG HIP (WITH OR WITHOUT PELVIS) 2-3V RIGHT COMPARISON:  None. FINDINGS: No acute fracture or dislocation is identified. A bipolar hemiarthroplasty shows normal alignment. The bony pelvis shows osteopenia. Vascular calcifications are present. Soft tissues are unremarkable. IMPRESSION: No acute fracture identified. A bipolar arthroplasty shows normal alignment.  Electronically Signed   By: Irish Lack M.D.   On: 01/10/2015 14:35   Dg Femur, Min 2 Views Right  01/10/2015  CLINICAL DATA:  Fall last night; pt stated she slid from bed and landed right side as she was putting on pajamas. Insists she did not fall but slid. Pain midshaft right femur Hx prior right surgery 2012, cannot recall date of left hip surgery states "years and years" ago EXAM: RIGHT FEMUR 2 VIEWS COMPARISON:  01/10/2015 hip FINDINGS: Patient has had previous right hip arthroplasty. The femur is intact. Bones appear radiolucent. There is dense atherosclerotic calcification of the right femoral and popliteal artery. Degenerative changes are seen at the knee. Evaluation of the knee is degraded by technique. Dedicated knee views are recommended if needed. IMPRESSION: No evidence for acute fracture of the femur. Electronically Signed   By: Norva Pavlov M.D.   On: 01/10/2015 14:33    Assessment/Plan  Right non displaced Proximal femur fracture  nonoperative management. Will have patient work with PT/OT as tolerated to regain strength and restore function.  Fall  precautions are in place. Has to limit weightbearing on her right hip 4-6 weeks. Continue celebrex bid with oxyIR 5 mg q6h prn with tylenol prn for pain. Has follow up with orthopedics.   Unsteady gait Post fracture. Will have her work with physical therapy and occupational therapy team to help with gait training and muscle strengthening exercises.fall precautions. Skin care. Encourage to be out of bed.   Acute renal impairment Off hctz for now, monitor bmp  Dry eye With some erythema to right conjunctiva. Start artificial tear drops bid for now and reassess  UTI To complete her ciprofloxacin today, asymptomatic. No growth noted on urine culture. Hydration encouraged  Hypertension  Off HCTZ. bp currently stable, monitor vital signs  High fall risk With recent fall and fracture and now has unsteady gait. Fall precautions, to work with therapy team  Scalp laceration Has healed well with scab formation, monitor  Overactive bladder  continue Vesicare 5 mg daily  Depression continue Lexapro 10 mg daily and Remeron 15 mg daily and monitor   Goals of care: short term rehabilitation   Labs/tests ordered: cbc, cmp  Family/ staff Communication: reviewed care plan with patient and nursing supervisor    Oneal Grout, MD  [facility] [phone] (Monday-Friday 8 am - 5 pm) [phone] (afterhours)

## 2015-02-04 ENCOUNTER — Encounter: Payer: Medicare Other | Admitting: Internal Medicine

## 2015-02-06 ENCOUNTER — Encounter: Payer: Self-pay | Admitting: Adult Health

## 2015-03-25 ENCOUNTER — Encounter: Payer: Self-pay | Admitting: Internal Medicine

## 2015-03-25 ENCOUNTER — Ambulatory Visit (INDEPENDENT_AMBULATORY_CARE_PROVIDER_SITE_OTHER): Payer: Medicare Other | Admitting: Internal Medicine

## 2015-03-25 VITALS — BP 140/70 | HR 44 | Temp 97.9°F | Resp 18 | Ht 64.0 in | Wt 113.4 lb

## 2015-03-25 DIAGNOSIS — R2681 Unsteadiness on feet: Secondary | ICD-10-CM

## 2015-03-25 DIAGNOSIS — F329 Major depressive disorder, single episode, unspecified: Secondary | ICD-10-CM

## 2015-03-25 DIAGNOSIS — M129 Arthropathy, unspecified: Secondary | ICD-10-CM | POA: Diagnosis not present

## 2015-03-25 DIAGNOSIS — N3281 Overactive bladder: Secondary | ICD-10-CM

## 2015-03-25 DIAGNOSIS — Z0000 Encounter for general adult medical examination without abnormal findings: Secondary | ICD-10-CM | POA: Diagnosis not present

## 2015-03-25 DIAGNOSIS — N289 Disorder of kidney and ureter, unspecified: Secondary | ICD-10-CM

## 2015-03-25 DIAGNOSIS — H6123 Impacted cerumen, bilateral: Secondary | ICD-10-CM

## 2015-03-25 DIAGNOSIS — I10 Essential (primary) hypertension: Secondary | ICD-10-CM

## 2015-03-25 DIAGNOSIS — S72001S Fracture of unspecified part of neck of right femur, sequela: Secondary | ICD-10-CM | POA: Diagnosis not present

## 2015-03-25 MED ORDER — CELECOXIB 100 MG PO CAPS: 100.0000 mg | ORAL_CAPSULE | Freq: Two times a day (BID) | ORAL | Status: AC

## 2015-03-25 NOTE — Progress Notes (Signed)
Patient ID: Veronica [name], female   DOB: 01-Oct-1912, 82 y.o.   MRN: [id number] Subjective:     Veronica [name] is a 82 y.o. female and is here for a comprehensive physical exam. The patient reports problems - fall with right femur fx.  She fell in Nov 2016 and sustained right proximal femur fx around her prosthesis that was tx conservatively. She spent 3 days in hospital followed by 10 days short term rehab at [facility]. She completed home PT yesterday. Son states she gets afraid at night time and balls up in knot.   She has occasional episodes of sundowning where she does not recall where she is.  Pharmacy will be changed to [facility] or [facility] due to high cost of some meds. Needs rx for celebrex  Social History   Social History  . Marital Status: Widowed    Spouse Name: N/A  . Number of Children: N/A  . Years of Education: N/A   Occupational History  . Not on file.   Social History Main Topics  . Smoking status: Never Smoker   . Smokeless tobacco: Never Used  . Alcohol Use: No  . Drug Use: No  . Sexual Activity: Not Currently   Other Topics Concern  . Not on file   Social History Narrative   Health Maintenance  Topic Date Due  . Janet Berlin  [birth year]/1933  . ZOSTAVAX  12/25/1974  . DEXA SCAN  12/25/1979  . PNA vac Low Risk Adult (1 of 2 - PCV13) 12/25/1979  . INFLUENZA VACCINE  05/29/2015 (Originally 09/29/2014)    Review of Systems   Review of Systems  Unable to perform ROS: other  very HOH   Objective:      Physical Exam  Constitutional: She is oriented to person, place, and time. No distress.  Frail appearing in NAD. Very HOH  HENT:  Head: Normocephalic and atraumatic.  Right Ear: External ear normal.  Left Ear: External ear normal.  Mouth/Throat: Oropharynx is clear and moist. No oropharyngeal exudate.  B/l cerumen impaction  Eyes: Conjunctivae and EOM are normal. Pupils are equal, round, and reactive to light. No scleral icterus.  Neck:  Normal range of motion. Neck supple. Carotid bruit is not present. No tracheal deviation present. No thyromegaly present.  Cardiovascular: Normal rate and intact distal pulses.  An irregular rhythm present. Frequent extrasystoles are present. Exam reveals no gallop and no friction rub.   Murmur (1/6 SEM) heard. Pulmonary/Chest: Effort normal and breath sounds normal. She has no wheezes. She has no rhonchi. She has no rales. She exhibits no tenderness. Right breast exhibits no inverted nipple, no nipple discharge and no skin change. Left breast exhibits no inverted nipple, no nipple discharge and no skin change. Breasts are symmetrical.  Abdominal: Soft. Bowel sounds are normal. She exhibits no distension and no mass. There is no hepatosplenomegaly. There is no tenderness. There is no rebound and no guarding.  Genitourinary:  Deferred due to age  Musculoskeletal: Normal range of motion. She exhibits edema and tenderness.  Right ankle; right iliotibial band hypertrophy  Lymphadenopathy:    She has no cervical adenopathy.  Neurological: She is alert and oriented to person, place, and time. She has normal reflexes.  Unsteady gait  Skin: Skin is warm and dry. No rash noted.  Psychiatric: Mood, memory, affect and judgment normal.      Assessment:    Healthy female exam.       ICD-9-CM ICD-10-CM   1. Well  adult exam V70.0 Z00.00   2. Major depression, chronic (HCC) 296.20 F32.9 CMP  3. Arthritis, multiple joint involvement 716.99 M12.9 celecoxib (CELEBREX) 100 MG capsule  4. Unsteady gait 781.2 R26.81   5. Hip fracture, right, sequela 905.3 S72.001S   6. Essential hypertension 401.9 I10   7. OAB (overactive bladder) 596.51 N32.81   8. Renal impairment 593.9 N28.9 CMP  9. Cerumen impaction, bilateral 380.4 H61.23     Plan:     See After Visit Summary for Counseling Recommendations    Pt is UTD on health maintenance. She has aged out of colonoscopy, mammograms, DXA scans. Vaccinations  are UTD. Pt maintains a healthy lifestyle. Encouraged pt to exercise 30-45 minutes 4-5 times per week. Eat a well balanced diet. Avoid smoking. Limit alcohol intake. Wear seatbelt when riding in the car. Wear sun block (SPF >50) when spending extended times outside.  Continue current medications as ordered'  Will call with lab results  Follow up in 4 mos for routine visit  Nickalous Stingley S. Ancil Linsey  [facility] [address] [place], [place] [address] [phone] Cell (Monday-Friday 8 AM - 5 PM) [phone] After 5 PM and follow prompts

## 2015-03-25 NOTE — Patient Instructions (Addendum)
Encouraged pt to exercise 30-45 minutes 4-5 times per week. Eat a well balanced diet. Avoid smoking. Limit alcohol intake. Wear seatbelt when riding in the car. Wear sun block (SPF >50) when spending extended times outside.  Continue current medications as ordered'  Will call with lab results  Follow up in 4 mos for routine visit

## 2015-05-26 ENCOUNTER — Emergency Department (HOSPITAL_COMMUNITY): Payer: Medicare Other

## 2015-05-26 ENCOUNTER — Encounter (HOSPITAL_COMMUNITY): Payer: Self-pay

## 2015-05-26 ENCOUNTER — Inpatient Hospital Stay (HOSPITAL_COMMUNITY)
Admission: EM | Admit: 2015-05-26 | Discharge: 2015-05-29 | DRG: 194 | Disposition: A | Discharge status: Skilled Nursing Facility | Payer: Medicare Other | Attending: Internal Medicine | Admitting: Internal Medicine

## 2015-05-26 DIAGNOSIS — R636 Underweight: Secondary | ICD-10-CM | POA: Diagnosis not present

## 2015-05-26 DIAGNOSIS — I10 Essential (primary) hypertension: Secondary | ICD-10-CM | POA: Diagnosis present

## 2015-05-26 DIAGNOSIS — F419 Anxiety disorder, unspecified: Secondary | ICD-10-CM | POA: Diagnosis present

## 2015-05-26 DIAGNOSIS — I129 Hypertensive chronic kidney disease with stage 1 through stage 4 chronic kidney disease, or unspecified chronic kidney disease: Secondary | ICD-10-CM | POA: Diagnosis not present

## 2015-05-26 DIAGNOSIS — Y95 Nosocomial condition: Secondary | ICD-10-CM | POA: Diagnosis not present

## 2015-05-26 DIAGNOSIS — Z66 Do not resuscitate: Secondary | ICD-10-CM | POA: Diagnosis present

## 2015-05-26 DIAGNOSIS — Z96611 Presence of right artificial shoulder joint: Secondary | ICD-10-CM | POA: Diagnosis not present

## 2015-05-26 DIAGNOSIS — Z681 Body mass index (BMI) 19 or less, adult: Secondary | ICD-10-CM

## 2015-05-26 DIAGNOSIS — Z79899 Other long term (current) drug therapy: Secondary | ICD-10-CM

## 2015-05-26 DIAGNOSIS — R0902 Hypoxemia: Secondary | ICD-10-CM | POA: Diagnosis not present

## 2015-05-26 DIAGNOSIS — J189 Pneumonia, unspecified organism: Principal | ICD-10-CM

## 2015-05-26 DIAGNOSIS — R64 Cachexia: Secondary | ICD-10-CM | POA: Diagnosis not present

## 2015-05-26 DIAGNOSIS — Z23 Encounter for immunization: Secondary | ICD-10-CM

## 2015-05-26 DIAGNOSIS — I480 Paroxysmal atrial fibrillation: Secondary | ICD-10-CM | POA: Diagnosis not present

## 2015-05-26 DIAGNOSIS — E860 Dehydration: Secondary | ICD-10-CM | POA: Diagnosis not present

## 2015-05-26 DIAGNOSIS — I4891 Unspecified atrial fibrillation: Secondary | ICD-10-CM | POA: Diagnosis not present

## 2015-05-26 DIAGNOSIS — Z79891 Long term (current) use of opiate analgesic: Secondary | ICD-10-CM

## 2015-05-26 DIAGNOSIS — I452 Bifascicular block: Secondary | ICD-10-CM | POA: Diagnosis present

## 2015-05-26 DIAGNOSIS — E876 Hypokalemia: Secondary | ICD-10-CM | POA: Diagnosis not present

## 2015-05-26 DIAGNOSIS — F418 Other specified anxiety disorders: Secondary | ICD-10-CM | POA: Diagnosis present

## 2015-05-26 DIAGNOSIS — Z9181 History of falling: Secondary | ICD-10-CM

## 2015-05-26 DIAGNOSIS — N3281 Overactive bladder: Secondary | ICD-10-CM | POA: Diagnosis present

## 2015-05-26 DIAGNOSIS — N183 Chronic kidney disease, stage 3 unspecified: Secondary | ICD-10-CM | POA: Diagnosis present

## 2015-05-26 HISTORY — DX: Chronic kidney disease, stage 2 (mild): N18.2

## 2015-05-26 LAB — CBC WITH DIFFERENTIAL/PLATELET
BASOS PCT: 0 %
Basophils Absolute: 0 10*3/uL (ref 0.0–0.1)
EOS ABS: 0 10*3/uL (ref 0.0–0.7)
Eosinophils Relative: 0 %
HCT: 43.1 % (ref 36.0–46.0)
HEMOGLOBIN: 13.9 g/dL (ref 12.0–15.0)
Lymphocytes Relative: 10 %
Lymphs Abs: 1 10*3/uL (ref 0.7–4.0)
MCH: 29.7 pg (ref 26.0–34.0)
MCHC: 32.3 g/dL (ref 30.0–36.0)
MCV: 92.1 fL (ref 78.0–100.0)
MONO ABS: 0.2 10*3/uL (ref 0.1–1.0)
MONOS PCT: 2 %
Neutro Abs: 9 10*3/uL — ABNORMAL HIGH (ref 1.7–7.7)
Neutrophils Relative %: 88 %
Platelets: 275 10*3/uL (ref 150–400)
RBC: 4.68 MIL/uL (ref 3.87–5.11)
RDW: 13.7 % (ref 11.5–15.5)
WBC: 10.1 10*3/uL (ref 4.0–10.5)

## 2015-05-26 LAB — COMPREHENSIVE METABOLIC PANEL
ALBUMIN: 3.4 g/dL — AB (ref 3.5–5.0)
ALK PHOS: 99 U/L (ref 38–126)
ALT: 12 U/L — AB (ref 14–54)
AST: 26 U/L (ref 15–41)
Anion gap: 11 (ref 5–15)
BILIRUBIN TOTAL: 0.8 mg/dL (ref 0.3–1.2)
BUN: 23 mg/dL — AB (ref 6–20)
CALCIUM: 9.8 mg/dL (ref 8.9–10.3)
CO2: 25 mmol/L (ref 22–32)
Chloride: 107 mmol/L (ref 101–111)
Creatinine, Ser: 1.35 mg/dL — ABNORMAL HIGH (ref 0.44–1.00)
GFR calc Af Amer: 36 mL/min — ABNORMAL LOW (ref >60)
GFR calc non Af Amer: 31 mL/min — ABNORMAL LOW (ref >60)
GLUCOSE: 143 mg/dL — AB (ref 65–99)
Potassium: 3.6 mmol/L (ref 3.5–5.1)
Sodium: 143 mmol/L (ref 135–145)
TOTAL PROTEIN: 6.2 g/dL — AB (ref 6.5–8.1)

## 2015-05-26 LAB — URINALYSIS, ROUTINE W REFLEX MICROSCOPIC
Bilirubin Urine: NEGATIVE
Glucose, UA: NEGATIVE mg/dL
Hgb urine dipstick: NEGATIVE
Ketones, ur: 15 mg/dL — AB
LEUKOCYTES UA: NEGATIVE
NITRITE: NEGATIVE
PROTEIN: NEGATIVE mg/dL
Specific Gravity, Urine: 1.023 (ref 1.005–1.030)
pH: 5.5 (ref 5.0–8.0)

## 2015-05-26 LAB — TROPONIN I: TROPONIN I: 0.03 ng/mL (ref <0.031)

## 2015-05-26 LAB — RAPID URINE DRUG SCREEN, HOSP PERFORMED
Amphetamines: NOT DETECTED
BARBITURATES: NOT DETECTED
Benzodiazepines: NOT DETECTED
Cocaine: NOT DETECTED
Opiates: POSITIVE — AB
Tetrahydrocannabinol: NOT DETECTED

## 2015-05-26 LAB — PROTIME-INR
INR: 1.09 (ref 0.00–1.49)
PROTHROMBIN TIME: 14.3 s (ref 11.6–15.2)

## 2015-05-26 LAB — ETHANOL

## 2015-05-26 LAB — APTT: aPTT: 29 seconds (ref 24–37)

## 2015-05-26 MED ORDER — CEFTRIAXONE SODIUM 1 G IJ SOLR
1.0000 g | Freq: Once | INTRAMUSCULAR | Status: AC
  Administered 2015-05-26: 1 g via INTRAVENOUS
  Filled 2015-05-26: qty 10

## 2015-05-26 MED ORDER — HEPARIN SODIUM (PORCINE) 5000 UNIT/ML IJ SOLN
5000.0000 [IU] | Freq: Three times a day (TID) | INTRAMUSCULAR | Status: DC
  Administered 2015-05-27 (×2): 5000 [IU] via SUBCUTANEOUS
  Filled 2015-05-26 (×2): qty 1

## 2015-05-26 MED ORDER — ADULT MULTIVITAMIN W/MINERALS CH
1.0000 | ORAL_TABLET | Freq: Every day | ORAL | Status: DC
  Administered 2015-05-27 – 2015-05-29 (×3): 1 via ORAL
  Filled 2015-05-26 (×3): qty 1

## 2015-05-26 MED ORDER — SODIUM CHLORIDE 0.9 % IV BOLUS (SEPSIS)
500.0000 mL | Freq: Once | INTRAVENOUS | Status: AC
  Administered 2015-05-26: 500 mL via INTRAVENOUS

## 2015-05-26 MED ORDER — MIRTAZAPINE 15 MG PO TABS
15.0000 mg | ORAL_TABLET | Freq: Every day | ORAL | Status: DC
  Administered 2015-05-27 – 2015-05-28 (×3): 15 mg via ORAL
  Filled 2015-05-26 (×3): qty 1

## 2015-05-26 MED ORDER — ACETAMINOPHEN 325 MG PO TABS
650.0000 mg | ORAL_TABLET | Freq: Four times a day (QID) | ORAL | Status: DC | PRN
  Administered 2015-05-29: 650 mg via ORAL
  Filled 2015-05-26: qty 2

## 2015-05-26 MED ORDER — DEXTROSE 5 % IV SOLN
1.0000 g | Freq: Three times a day (TID) | INTRAVENOUS | Status: DC
  Filled 2015-05-26 (×2): qty 1

## 2015-05-26 MED ORDER — ESCITALOPRAM OXALATE 10 MG PO TABS
10.0000 mg | ORAL_TABLET | Freq: Every day | ORAL | Status: DC
  Administered 2015-05-27 – 2015-05-29 (×3): 10 mg via ORAL
  Filled 2015-05-26 (×3): qty 1

## 2015-05-26 MED ORDER — OXYCODONE HCL 5 MG PO TABS: 5.0000 mg | ORAL_TABLET | Freq: Four times a day (QID) | ORAL | Status: DC | PRN

## 2015-05-26 MED ORDER — DEXTROSE 5 % IV SOLN
1.0000 g | INTRAVENOUS | Status: DC
  Administered 2015-05-27 – 2015-05-29 (×3): 1 g via INTRAVENOUS
  Filled 2015-05-26 (×3): qty 1

## 2015-05-26 MED ORDER — DARIFENACIN HYDROBROMIDE ER 7.5 MG PO TB24
7.5000 mg | ORAL_TABLET | Freq: Every day | ORAL | Status: DC
  Administered 2015-05-27 – 2015-05-29 (×3): 7.5 mg via ORAL
  Filled 2015-05-26 (×6): qty 1

## 2015-05-26 MED ORDER — DEXTROSE 5 % IV SOLN
500.0000 mg | Freq: Once | INTRAVENOUS | Status: AC
  Administered 2015-05-26: 500 mg via INTRAVENOUS
  Filled 2015-05-26: qty 500

## 2015-05-26 MED ORDER — SODIUM CHLORIDE 0.9 % IV SOLN
100.0000 mL/h | INTRAVENOUS | Status: DC
  Administered 2015-05-26: 100 mL/h via INTRAVENOUS

## 2015-05-26 MED ORDER — CELECOXIB 100 MG PO CAPS
100.0000 mg | ORAL_CAPSULE | Freq: Two times a day (BID) | ORAL | Status: DC
  Administered 2015-05-27 – 2015-05-29 (×6): 100 mg via ORAL
  Filled 2015-05-26 (×9): qty 1

## 2015-05-26 MED ORDER — POTASSIUM CHLORIDE IN NACL 20-0.9 MEQ/L-% IV SOLN
INTRAVENOUS | Status: DC
  Administered 2015-05-27: 1000 mL via INTRAVENOUS
  Filled 2015-05-26 (×2): qty 1000

## 2015-05-26 NOTE — H&P (Signed)
[facility] History and Physical  Veronica NidaVerniece B Canupp AVW:[id number] DOB: Jul 20, 1912 DOA: 05/26/2015  Referring physician: ED physician PCP: Kirt Boysarter, Monica, DO  Specialists: None listed   Chief Complaint: Lethargy, weakness  HPI: Veronica [name] is a 82 y.o. female with PMH of depression, anxiety, and chronic kidney disease stage III who is brought into the ED for 1 week of lethargy and weakness that is acutely worsened the day of admission. At baseline, the patient reportedly ambulates with a walker and engages in conversation with visitors. For the past week, she has exhibited progressive lethargy and weakness, culminating in her inability to sit up by herself and nonverbal state today. Patient has not voiced any specific complaints and has not been noted to have cough, fever, vomiting, or diarrhea. There has been no apparent fall or trauma.   In ED, patient was found to be afebrile, saturating adequately on room air, and with vital signs stable. EKG demonstrates atrial fibrillation with LVH by voltage criteria, RBBB and left anterior fascicular block. Chest x-ray demonstrates an opacity in the left base concerning for possible pneumonia. Incidental notation is made of aortic root dilation to 7 cm on the chest x-ray. A head CT is obtained without contrast and negative for acute intracranial abnormalities. CMP is notable for serum creatinine of 1.35, up from an apparent baseline of 1.1. CBC is unremarkable and troponin is 0.03. Patient was bolused with 500 mL normal saline in the emergency department and treated with empiric azithromycin and Rocephin. Given her apparent residence in a nursing facility, patient will be admitted for ongoing evaluation and management of suspected HCAP.   Where does patient live?   SNF    Can patient participate in ADLs?  Yes         Review of Systems:  Unable to obtain ROS secondary to patient's clinical condition with acute confusional state.     Allergy: No  Known Allergies  Past Medical History  Diagnosis Date  . Hx of recurrent vertebral fractures   . Anxiety   . History of shingles     severe; abdominal    Past Surgical History  Procedure Laterality Date  . Eye surgery      cataracts  . Fracture surgery Right 2012    Scott Dean; hip repair  . Fracture surgery Right 2011    shoulder shattered due to fall    Social History:  reports that she has never smoked. She has never used smokeless tobacco. She reports that she does not drink alcohol or use illicit drugs.  Family History:  Family History  Problem Relation Age of Onset  . Stroke Mother   . Stroke Father   . COPD Sister      Prior to Admission medications   Medication Sig Start Date End Date Taking? Authorizing Provider  acetaminophen (TYLENOL) 325 MG tablet Take 2 tablets (650 mg total) by mouth every 6 (six) hours as needed for mild pain (or Fever >/= 101). 01/13/15  Yes Kathryne Hitchhristopher Y Blackman, MD  celecoxib (CELEBREX) 100 MG capsule Take 1 capsule (100 mg total) by mouth 2 (two) times daily. 03/25/15  Yes Monica Carter, DO  escitalopram (LEXAPRO) 10 MG tablet Take 1 tablet (10 mg total) by mouth daily. 10/22/14  Yes Kirt BoysMonica Carter, DO  mirtazapine (REMERON) 15 MG tablet Take 1 tablet (15 mg total) by mouth at bedtime. 06/18/14  Yes Kirt BoysMonica Carter, DO  Multiple Vitamins-Minerals ([employer] SPECTRAVITE ADULT 50+) TABS Take 1 tablet by mouth daily.  Yes Historical Provider, MD  oxyCODONE (OXY IR/ROXICODONE) 5 MG immediate release tablet Take 1 tablet (5 mg total) by mouth every 6 (six) hours as needed for severe pain. 01/13/15  Yes Ripudeep Jenna Luo, MD  solifenacin (VESICARE) 5 MG tablet Take 1 tablet (5 mg total) by mouth daily. 06/18/14  Yes Kirt Boys, DO    Physical Exam: Filed Vitals:   05/26/15 1949 05/26/15 2000 05/26/15 2114 05/26/15 2116  BP:  140/82  151/92  Pulse: 52 54  78  Temp:    98.5 F (36.9 C)  TempSrc:    Oral  Resp: Height:    (1.702 m)    Weight:   51.256 kg (113 lb)   SpO2: 93% 95%  100%   General: Not in acute distress HEENT:       Eyes: PERRL, EOMI, no scleral icterus or conjunctival pallor.       ENT: No discharge from the ears or nose, no pharyngeal ulcers, oral mucosa dry.        Neck: No JVD, no bruit, no appreciable mass Heme: No cervical adenopathy, no pallor Cardiac: S1/S2, RRR, soft systolic/diastolic murmur at LSB, No gallops or rubs. Pulm: Good air movement bilaterally. Coarse ronchi bilateral lower lung fields. Abd: Soft, nondistended, nontender, no rebound pain or gaurding, BS present. Ext: No LE edema bilaterally. 2+DP/PT pulse bilaterally. Musculoskeletal: No gross deformity, no red, hot, swollen joints   Skin: No rashes or wounds on exposed surfaces  Neuro: Alert, disoriented, but follows commands, cranial nerves II-XII grossly intact. No focal findings Psych: Patient is not overtly psychotic, though disoriented and inappropriately affectionate with staff.  Labs on Admission:  Basic Metabolic Panel:  Recent Labs Lab 05/26/15 1840  NA 143  K 3.6  CL 107  CO2 25  GLUCOSE 143*  BUN 23*  CREATININE 1.35*  CALCIUM 9.8   Liver Function Tests:  Recent Labs Lab 05/26/15 1840  AST 26  ALT 12*  ALKPHOS 99  BILITOT 0.8  PROT 6.2*  ALBUMIN 3.4*   No results for input(s): LIPASE, AMYLASE in the last 168 hours. No results for input(s): AMMONIA in the last 168 hours. CBC:  Recent Labs Lab 05/26/15 1840  WBC 10.1  NEUTROABS 9.0*  HGB 13.9  HCT 43.1  MCV 92.1  PLT 275   Cardiac Enzymes:  Recent Labs Lab 05/26/15 1840  TROPONINI 0.03    BNP (last 3 results) No results for input(s): BNP in the last 8760 hours.  ProBNP (last 3 results) No results for input(s): PROBNP in the last 8760 hours.  CBG: No results for input(s): GLUCAP in the last 168 hours.  Radiological Exams on Admission: Dg Chest 2 View  05/26/2015  CLINICAL DATA:  One 82 year old female with altered  mental status and lethargy EXAM: CHEST  2 VIEW COMPARISON:  Chest radiograph dated 11/10/2009 FINDINGS: Two views of the chest demonstrate emphysematous changes of the lungs. Left lung base linear and hazy density likely represent atelectasis/scarring. Developing pneumonia is not excluded. There is no focal consolidation, pleural effusion, or pneumothorax. There is mild cardiomegaly. There is atherosclerotic calcification of the aorta. There is dilatation of the aortic root measuring up to 7 cm in diameter. There is a past osteopenia with multilevel degenerative changes of the spine. Partially visualized right shoulder arthroplasty. IMPRESSION: Left lung base atelectasis/scarring. Developing pneumonia is excluded. Dilatation of the aortic root measuring up to 7 cm in diameter. Electronically Signed   By:  Elgie Collard M.D.   On: 05/26/2015 19:31   Ct Head Wo Contrast  05/26/2015  CLINICAL DATA:  Lethargy.  Evaluate for stroke. EXAM: CT HEAD WITHOUT CONTRAST TECHNIQUE: Contiguous axial images were obtained from the base of the skull through the vertex without intravenous contrast. COMPARISON:  01/10/2015 FINDINGS: Skull and Sinuses:Negative for fracture or destructive process. The visualized mastoids, middle ears, and imaged paranasal sinuses are clear. Notably advanced TMJ osteoarthritis. Visualized orbits: Bilateral cataract resection.  No acute finding. Brain: No evidence of acute infarction, hemorrhage, hydrocephalus, or mass lesion/mass effect. There is generalized atrophy with prominent involvement at the mesial temporal lobes, a pattern seen with Alzheimer's disease. Chronic small-vessel disease with confluent ischemic gliosis in the deep cerebral white matter and remote small vessel infarct in the right cerebellum. IMPRESSION: 1. No acute finding or change since 2016. 2. Atrophy and chronic small vessel disease as described. Electronically Signed   By: Marnee Spring M.D.   On: 05/26/2015 19:35     EKG: Independently reviewed.  Abnormal findings: Atrial fibrillation, RBBB, LaFB, LVH    Assessment/Plan  1. HCAP  - Left lower lobe opacity on CXR concerning for PNA  - Received azithromycin and Rocephin in ED empirically, but she lives in SNF per report, so will treat as HCAP  - Empiric cefepime and vancomycin; de-escalate according to forthcoming microbiology data  - Trend lactate, procalcitonin - Check urine for strep pneumo, legionella antigens  - Check sputum culture; MRSA screen  - Titrate FiO2 to maintain saturations >92%   2. CKD stage III  - SCr 1.35, up from apparent baseline of 1.1  - Suspect the bump is d/t the current illness and associated dehydration  - Gently hydrating with IVF overnight, anticipate improvement with this  - Repeat chem panel in am    3. Atrial fibrillation  - Noted on admission EKG, appears to be new-onset, possibly secondary to acute pulmonary disease  - Rate is well-controlled  - CHADS-VASc is 4 for age x2, gender, HTN; has history of falls with hip fx   4. Depression, anxiety  - Continue home-dose Lexapro, Remeron  - Appears to be stable    5. Hypertension  - Has been slightly elevated here thus far  - Not on antihypertensives at home  - Hydralazine 10 mg IVP prn for now   DVT ppx: SQ Heparin    Code Status: DNR Family Communication: None at bed side.               Disposition Plan: Admit to inpatient   Date of Service 05/26/2015    Briscoe Deutscher, MD [facility] Pager [phone]  If 7PM-7AM, please contact night-coverage [email or web address] Password [facility] 05/26/2015, 11:52 PM

## 2015-05-26 NOTE — ED Notes (Signed)
Per EMS - pt from home. Per pt son, pt usually ambulatory w/ walker, takes care of self, is self-sufficient, carries on full conversations. Pt does have hearing loss. Pt did not wake up on own today, pt was lethargic. Pt unable to sit up on bed by herself. Incontinent upon EMS arrival. Pt more lethargic this week but acute change today from yesterday. Pt weak, a&o x4 but unable to follow commands. Prev injury to right arm; swollen right arm and right leg and neglects right side.

## 2015-05-26 NOTE — ED Provider Notes (Signed)
CSN: [id number]     Arrival date & time 05/26/15  1815 History   First MD Initiated Contact with Patient 05/26/15 1817     Chief Complaint  Patient presents with  . Altered Mental Status     (Consider location/radiation/quality/duration/timing/severity/associated sxs/prior Treatment) HPI  Patient presents with concern of decreased interactivity, somnolence, changes in behavior. History is provided by EMS providers, who discussed the patient's change with past few days, with her son. Her poorly, the patient is likely awake, alert, ambulatory. Over the past few days, she has had substantial generalized change, with new inability to follow commands consistently, weakness, but no falls, no reported fever, vomiting, diarrhea. Per report, patient's prior injury to her right arm, right leg. Level V caveat secondary to the patient's change in mental status.   Past Medical History  Diagnosis Date  . Hx of recurrent vertebral fractures   . Anxiety   . History of shingles     severe; abdominal   Past Surgical History  Procedure Laterality Date  . Eye surgery      cataracts  . Fracture surgery Right 2012    Scott Dean; hip repair  . Fracture surgery Right 2011    shoulder shattered due to fall   Family History  Problem Relation Age of Onset  . Stroke Mother   . Stroke Father   . COPD Sister    Social History  Substance Use Topics  . Smoking status: Never Smoker   . Smokeless tobacco: Never Used  . Alcohol Use: No   OB History    No data available     Review of Systems  Unable to perform ROS: Mental status change      Allergies  Review of patient's allergies indicates no known allergies.  Home Medications   Prior to Admission medications   Medication Sig Start Date End Date Taking? Authorizing Provider  acetaminophen (TYLENOL) 325 MG tablet Take 2 tablets (650 mg total) by mouth every 6 (six) hours as needed for mild pain (or Fever >/= 101). 01/13/15   Kathryne Hitch, MD  celecoxib (CELEBREX) 100 MG capsule Take 1 capsule (100 mg total) by mouth 2 (two) times daily. 03/25/15   Monica Carter, DO  escitalopram (LEXAPRO) 10 MG tablet Take 1 tablet (10 mg total) by mouth daily. 10/22/14   Kirt Boys, DO  mirtazapine (REMERON) 15 MG tablet Take 1 tablet (15 mg total) by mouth at bedtime. 06/18/14   Kirt Boys, DO  Multiple Vitamins-Minerals ([employer]S SPECTRAVITE ADULT 50+) TABS Take 1 tablet by mouth daily.    Historical Provider, MD  oxyCODONE (OXY IR/ROXICODONE) 5 MG immediate release tablet Take 1 tablet (5 mg total) by mouth every 6 (six) hours as needed for severe pain. 01/13/15   Ripudeep Jenna Luo, MD  solifenacin (VESICARE) 5 MG tablet Take 1 tablet (5 mg total) by mouth daily. 06/18/14   Monica Carter, DO   BP 188/86 mmHg  Pulse 93  Temp(Src) 97.5 F (36.4 C) (Oral)  Resp 19  SpO2 90% Physical Exam  Constitutional: She appears cachectic. She has a sickly appearance. No distress.  HENT:  Head: Normocephalic and atraumatic.  Eyes: Conjunctivae and EOM are normal.  Cardiovascular: Normal rate and regular rhythm.   Pulmonary/Chest: Effort normal and breath sounds normal. No stridor. No respiratory distress.  Abdominal: She exhibits no distension.  Musculoskeletal: She exhibits no edema.  Neurological: She is alert. No cranial nerve deficit.  She does not follow neurologic commands appropriately, does  seemingly answer orientation questions briefly, appropriately. Patient has minimal motion of the right side spontaneously, it is unclear if this is new.   Skin: Skin is warm and dry.  Psychiatric: Cognition and memory are impaired.  Nursing note and vitals reviewed.   ED Course  Procedures (including critical care time) Labs Review Labs Reviewed  COMPREHENSIVE METABOLIC PANEL - Abnormal; Notable for the following:    Glucose, Bld 143 (*)    BUN 23 (*)    Creatinine, Ser 1.35 (*)    Total Protein 6.2 (*)    Albumin 3.4 (*)    ALT 12 (*)     GFR calc non Af Amer 31 (*)    GFR calc Af Amer 36 (*)    All other components within normal limits  CBC WITH DIFFERENTIAL/PLATELET - Abnormal; Notable for the following:    Neutro Abs 9.0 (*)    All other components within normal limits  URINE RAPID DRUG SCREEN, HOSP PERFORMED - Abnormal; Notable for the following:    Opiates POSITIVE (*)    All other components within normal limits  URINALYSIS, ROUTINE W REFLEX MICROSCOPIC (NOT AT [facility]) - Abnormal; Notable for the following:    Color, Urine AMBER (*)    Ketones, ur 15 (*)    All other components within normal limits  APTT  TROPONIN I  PROTIME-INR  ETHANOL    Imaging Review Dg Chest 2 View  05/26/2015  CLINICAL DATA:  One 82 year old female with altered mental status and lethargy EXAM: CHEST  2 VIEW COMPARISON:  Chest radiograph dated 11/10/2009 FINDINGS: Two views of the chest demonstrate emphysematous changes of the lungs. Left lung base linear and hazy density likely represent atelectasis/scarring. Developing pneumonia is not excluded. There is no focal consolidation, pleural effusion, or pneumothorax. There is mild cardiomegaly. There is atherosclerotic calcification of the aorta. There is dilatation of the aortic root measuring up to 7 cm in diameter. There is a past osteopenia with multilevel degenerative changes of the spine. Partially visualized right shoulder arthroplasty. IMPRESSION: Left lung base atelectasis/scarring. Developing pneumonia is excluded. Dilatation of the aortic root measuring up to 7 cm in diameter. Electronically Signed   By: Elgie Collard M.D.   On: 05/26/2015 19:31   Ct Head Wo Contrast  05/26/2015  CLINICAL DATA:  Lethargy.  Evaluate for stroke. EXAM: CT HEAD WITHOUT CONTRAST TECHNIQUE: Contiguous axial images were obtained from the base of the skull through the vertex without intravenous contrast. COMPARISON:  01/10/2015 FINDINGS: Skull and Sinuses:Negative for fracture or destructive process. The  visualized mastoids, middle ears, and imaged paranasal sinuses are clear. Notably advanced TMJ osteoarthritis. Visualized orbits: Bilateral cataract resection.  No acute finding. Brain: No evidence of acute infarction, hemorrhage, hydrocephalus, or mass lesion/mass effect. There is generalized atrophy with prominent involvement at the mesial temporal lobes, a pattern seen with Alzheimer's disease. Chronic small-vessel disease with confluent ischemic gliosis in the deep cerebral white matter and remote small vessel infarct in the right cerebellum. IMPRESSION: 1. No acute finding or change since 2016. 2. Atrophy and chronic small vessel disease as described. Electronically Signed   By: Marnee Spring M.D.   On: 05/26/2015 19:35   I have personally reviewed and evaluated these images and lab results as part of my medical decision-making.   EKG Interpretation   Date/Time:  Tuesday May 26 2015 18:22:04 EDT Ventricular Rate:  78 PR Interval:    QRS Duration: 147 QT Interval:  439 QTC Calculation: 500 R  Axis:   -79 Text Interpretation:  Atrial fibrillation RBBB and LAFB Left ventricular  hypertrophy Atrial fibrillation Right bundle branch block Left anterior  fasicular block Left ventricular hypertrophy Abnormal ekg Confirmed by  Gerhard MunchLOCKWOOD, Matteson Blue  MD ([id number]) on 05/26/2015 7:01:52 PM     EMS rhythm strip with atrial fibrillation, similar to our EKG.  On cardiac monitor, the patient remains in atrial fibrillation, rate 70s, 80s.  O2- 89-90%ra, abnormal  On repeat exam the patient remains in similar condition, generally ill-appearing  MDM  Elderly female presents from home with family concerns of changes in her interactivity. Here the patient does not follow neurologic commands consistently, but is oriented appropriately. Patient is afebrile, but has hypoxia, with oxygen saturation on room air about 90%. With x-ray findings concerning for pneumonia, and hypoxia, she was started on antibiotics,  admitted for further evaluation and management.  Gerhard Munchobert Bassem Bernasconi, MD 05/26/15 2014

## 2015-05-27 ENCOUNTER — Encounter (HOSPITAL_COMMUNITY): Payer: Self-pay | Admitting: General Practice

## 2015-05-27 DIAGNOSIS — N183 Chronic kidney disease, stage 3 (moderate): Secondary | ICD-10-CM

## 2015-05-27 DIAGNOSIS — I10 Essential (primary) hypertension: Secondary | ICD-10-CM

## 2015-05-27 DIAGNOSIS — F418 Other specified anxiety disorders: Secondary | ICD-10-CM

## 2015-05-27 DIAGNOSIS — J189 Pneumonia, unspecified organism: Principal | ICD-10-CM

## 2015-05-27 DIAGNOSIS — I480 Paroxysmal atrial fibrillation: Secondary | ICD-10-CM

## 2015-05-27 LAB — CBC WITH DIFFERENTIAL/PLATELET
BASOS ABS: 0 10*3/uL (ref 0.0–0.1)
Basophils Relative: 0 %
EOS PCT: 1 %
Eosinophils Absolute: 0.1 10*3/uL (ref 0.0–0.7)
HEMATOCRIT: 41 % (ref 36.0–46.0)
Hemoglobin: 13.9 g/dL (ref 12.0–15.0)
LYMPHS ABS: 2.1 10*3/uL (ref 0.7–4.0)
LYMPHS PCT: 19 %
MCH: 30.5 pg (ref 26.0–34.0)
MCHC: 33.9 g/dL (ref 30.0–36.0)
MCV: 89.9 fL (ref 78.0–100.0)
MONO ABS: 1 10*3/uL (ref 0.1–1.0)
Monocytes Relative: 9 %
NEUTROS ABS: 7.7 10*3/uL (ref 1.7–7.7)
Neutrophils Relative %: 71 %
PLATELETS: 283 10*3/uL (ref 150–400)
RBC: 4.56 MIL/uL (ref 3.87–5.11)
RDW: 13.4 % (ref 11.5–15.5)
WBC: 10.9 10*3/uL — ABNORMAL HIGH (ref 4.0–10.5)

## 2015-05-27 LAB — BASIC METABOLIC PANEL
ANION GAP: 9 (ref 5–15)
BUN: 18 mg/dL (ref 6–20)
CALCIUM: 9.3 mg/dL (ref 8.9–10.3)
CO2: 26 mmol/L (ref 22–32)
Chloride: 103 mmol/L (ref 101–111)
Creatinine, Ser: 1.02 mg/dL — ABNORMAL HIGH (ref 0.44–1.00)
GFR calc Af Amer: 51 mL/min — ABNORMAL LOW (ref >60)
GFR, EST NON AFRICAN AMERICAN: 44 mL/min — AB (ref >60)
GLUCOSE: 119 mg/dL — AB (ref 65–99)
POTASSIUM: 3.4 mmol/L — AB (ref 3.5–5.1)
Sodium: 138 mmol/L (ref 135–145)

## 2015-05-27 LAB — LACTIC ACID, PLASMA
Lactic Acid, Venous: 1.3 mmol/L (ref 0.5–2.0)
Lactic Acid, Venous: 1.8 mmol/L (ref 0.5–2.0)

## 2015-05-27 LAB — STREP PNEUMONIAE URINARY ANTIGEN: Strep Pneumo Urinary Antigen: NEGATIVE

## 2015-05-27 LAB — HIV ANTIBODY (ROUTINE TESTING W REFLEX): HIV Screen 4th Generation wRfx: NONREACTIVE

## 2015-05-27 LAB — PROCALCITONIN

## 2015-05-27 MED ORDER — ENSURE ENLIVE PO LIQD
237.0000 mL | Freq: Two times a day (BID) | ORAL | Status: DC
  Administered 2015-05-29 (×2): 237 mL via ORAL

## 2015-05-27 MED ORDER — PNEUMOCOCCAL VAC POLYVALENT 25 MCG/0.5ML IJ INJ
0.5000 mL | INJECTION | INTRAMUSCULAR | Status: AC
  Administered 2015-05-29: 0.5 mL via INTRAMUSCULAR
  Filled 2015-05-27: qty 0.5
  Filled 2015-05-27: qty 1

## 2015-05-27 MED ORDER — HYDRALAZINE HCL 20 MG/ML IJ SOLN: 10.0000 mg | INTRAMUSCULAR | Status: DC | PRN

## 2015-05-27 MED ORDER — VANCOMYCIN HCL IN DEXTROSE 750-5 MG/150ML-% IV SOLN
750.0000 mg | INTRAVENOUS | Status: DC
  Administered 2015-05-27 – 2015-05-29 (×2): 750 mg via INTRAVENOUS
  Filled 2015-05-27 (×2): qty 150

## 2015-05-27 MED ORDER — ENOXAPARIN SODIUM 30 MG/0.3ML ~~LOC~~ SOLN
30.0000 mg | SUBCUTANEOUS | Status: DC
  Administered 2015-05-27 – 2015-05-28 (×2): 30 mg via SUBCUTANEOUS
  Filled 2015-05-27 (×2): qty 0.3

## 2015-05-27 MED ORDER — POTASSIUM CHLORIDE CRYS ER 20 MEQ PO TBCR
20.0000 meq | EXTENDED_RELEASE_TABLET | Freq: Once | ORAL | Status: AC
  Administered 2015-05-27: 20 meq via ORAL
  Filled 2015-05-27: qty 1

## 2015-05-27 NOTE — Progress Notes (Signed)
Spoke to WarwickVanessa from pharmacy regarding the Vancomycin and cefepime, which were ordered at the same time.  They are not compatible. Vanc to be hung with normas saline. When the normal saline with 20mEq K comes to the floor, both meds are compatible with that, also.  Meds are off schedule d/t fact they were not on floor. Dorothe PeaKaren Diane Mochizuki, RN

## 2015-05-27 NOTE — Care Management Note (Signed)
Case Management Note  Patient Details  Name: Veronica [name] MRN: [id number] Date of Birth: Sep 05, 1912  Subjective/Objective:  82 y.o.F admitted from HOME with Son (confirmed by CM ) with Pneumonia.                Action/Plan: spoke with son who reports pt ambulatory with walker pta. Concerned that she will not be able to care for herself at discharge and would like STSNF. Made CSW aware of above.    Expected Discharge Date:                  Expected Discharge Plan:  Skilled Nursing Facility (from SNF? )  In-House Referral:  Clinical Social Work  Discharge planning Services  CM Consult  Post Acute Care Choice:  Durable Medical Equipment Choice offered to:     DME Arranged:    DME Agency:     HH Arranged:    HH Agency:     Status of Service:  In process, will continue to follow  Medicare Important Message Given:    Date Medicare IM Given:    Medicare IM give by:    Date Additional Medicare IM Given:    Additional Medicare Important Message give by:     If discussed at Long Length of Stay Meetings, dates discussed:    Additional Comments:  [name], [name], RN 05/27/2015, 3:49 PM

## 2015-05-27 NOTE — Progress Notes (Signed)
Paged oncall NP Schorr and made aware patient heart rate dropping in the 30s and 40s , trending in the 60's. No new orders given, will continue to monitor.

## 2015-05-27 NOTE — NC FL2 (Signed)
[place] MEDICAID FL2 LEVEL OF CARE SCREENING TOOL     IDENTIFICATION  Patient Name: Veronica [name] Birthdate: 11/25/1912 Sex: female Admission Date (Current Location): 05/26/2015  [facility] [place]   Facility and Address:  [facility], [address], [place], [place] [address]      Provider Number: [id number]  Attending Physician Name and Address:  Dorothea OgleIskra M Myers, MD  Relative Name and Phone Number:  Lauralyn PrimesMichael Guile patient's son [phone]    Current Level of Care: Hospital Recommended Level of Care: Skilled Nursing Facility Prior Approval Number:    Date Approved/Denied:   PASRR Number: [id number]  Discharge Plan: SNF    Current Diagnoses: Patient Active Problem List   Diagnosis Date Noted  . CAP (community acquired pneumonia) 05/26/2015  . HCAP (healthcare-associated pneumonia) 05/26/2015  . CKD (chronic kidney disease), stage III 05/26/2015  . Atrial fibrillation (HCC) 05/26/2015  . Hip fracture (HCC) 01/10/2015  . Essential hypertension 01/10/2015  . Scalp laceration 01/10/2015  . Fall 01/10/2015  . History of right shoulder fracture 01/10/2015  . Arthritis, multiple joint involvement 05/02/2014  . Depression with anxiety 05/02/2014  . Cholelithiasis 05/02/2014  . OAB (overactive bladder) 05/02/2014  . Loss of weight 05/02/2014  . Memory loss, short term 05/02/2014  . Hearing loss 05/02/2014  . Insomnia 05/02/2014    Orientation RESPIRATION BLADDER Height & Weight     Self, Time  Normal Incontinent Weight: 51.256 kg (113 lb) Height:  5\' 7"  (170.2 cm)  BEHAVIORAL SYMPTOMS/MOOD NEUROLOGICAL BOWEL NUTRITION STATUS      Continent  (Please see DC summary)  AMBULATORY STATUS COMMUNICATION OF NEEDS Skin   Extensive Assist Verbally Normal                       Personal Care Assistance Level of Assistance  Bathing, Feeding, Dressing Bathing Assistance: Maximum assistance Feeding assistance: Limited  assistance Dressing Assistance: Maximum assistance     Functional Limitations Info  Hearing   Hearing Info: Impaired      SPECIAL CARE FACTORS FREQUENCY  PT (By licensed PT)     PT Frequency: Not yet assessed              Contractures      Additional Factors Info  Code Status, Allergies Code Status Info: DNR Allergies Info: NKA           Current Medications (05/27/2015):  This is the current hospital active medication list Current Facility-Administered Medications  Medication Dose Route Frequency Provider Last Rate Last Dose  . acetaminophen (TYLENOL) tablet 650 mg  650 mg Oral Q6H PRN Lavone Neriimothy S Opyd, MD      . ceFEPIme (MAXIPIME) 1 g in dextrose 5 % 50 mL IVPB  1 g Intravenous Q24H Juliette MangleVeronda P Bryk, RPH   1 g at 05/27/15 0043  . celecoxib (CELEBREX) capsule 100 mg  100 mg Oral BID Lavone Neriimothy S Opyd, MD   100 mg at 05/27/15 1114  . darifenacin (ENABLEX) 24 hr tablet 7.5 mg  7.5 mg Oral Daily Lavone Neriimothy S Opyd, MD   7.5 mg at 05/27/15 1114  . enoxaparin (LOVENOX) injection 30 mg  30 mg Subcutaneous Q24H Dorothea OgleIskra M Myers, MD      . escitalopram (LEXAPRO) tablet 10 mg  10 mg Oral Daily Briscoe Deutscherimothy S Opyd, MD   10 mg at 05/27/15 1056  . hydrALAZINE (APRESOLINE) injection 10 mg  10 mg Intravenous Q4H PRN Timothy S Opyd,  MD      . mirtazapine (REMERON) tablet 15 mg  15 mg Oral QHS Briscoe Deutscher, MD   15 mg at 05/27/15 0044  . multivitamin with minerals tablet 1 tablet  1 tablet Oral Daily Briscoe Deutscher, MD   1 tablet at 05/27/15 1056  . oxyCODONE (Oxy IR/ROXICODONE) immediate release tablet 5 mg  5 mg Oral Q6H PRN Briscoe Deutscher, MD      . Melene Muller ON 05/28/2015] pneumococcal 23 valent vaccine (PNU-IMMUNE) injection 0.5 mL  0.5 mL Intramuscular Tomorrow-1000 Dorothea Ogle, MD      . vancomycin (VANCOCIN) IVPB 750 mg/150 ml premix  750 mg Intravenous Q48H NORISSA BARTEE, RPH   750 mg at 05/27/15 1610     Discharge Medications: Please see discharge summary for a list of discharge  medications.  Relevant Imaging Results:  Relevant Lab Results:   Additional Information SSN: [id number]  Mearl Latin, LCSWA

## 2015-05-27 NOTE — Progress Notes (Signed)
Pharmacy Antibiotic Note  Lurena NidaVerniece B Aungst is a [age] y.o. female admitted on 05/26/2015 with pneumonia.  Pharmacy has been consulted for Vancomycin dosing. Pt is afebrile. WBC WNL. Mild bump in SCr. CXR can't rule out developing PNA.   Plan: Vancomycin 750 mg IV q48h Cefepime per MD Trend WBC, temp, renal function  Drug levels as indicated   Height: 5\' 7"  (170.2 cm) Weight: 113 lb (51.256 kg) IBW/kg (Calculated) : 61.6  Temp (24hrs), Avg:98 F (36.7 C), Min:97.5 F (36.4 C), Max:98.5 F (36.9 C)   Recent Labs Lab 05/26/15 1840  WBC 10.1  CREATININE 1.35*    Estimated Creatinine Clearance: 17.9 mL/min (by C-G formula based on Cr of 1.35).    No Known Allergies   Abran DukeLedford, [place] 05/27/2015 12:12 AM

## 2015-05-27 NOTE — Progress Notes (Signed)
Patient ID: Veronica [name], female   DOB: [birth year], 82 y.o.   MRN: [id number]  [facility] PROGRESS NOTE  Veronica NidaVerniece B Veronica [name]:[id number] DOB: [birth year] DOA: 05/26/2015 PCP: Kirt Boysarter, Monica, DO   Brief narrative:    [age] y.o. female with PMH of depression, anxiety, and chronic kidney disease stage III who is brought into the ED for 1 week of lethargy and weakness, poor oral intake, unable to ambulate (at baseline using walker but rather independent).   In ED, patient was found to be afebrile, saturating adequately on room air, and with vital signs stable. EKG demonstrates atrial fibrillation with LVH by voltage criteria, RBBB and left anterior fascicular block. Chest x-ray demonstrates an opacity in the left base concerning for possible pneumonia. Given her apparent residence in a nursing facility, patient was admitted for evaluation and management of suspected HCAP.  Assessment/Plan:    Principal Problem:   HCAP (healthcare-associated pneumonia) - Left lower lobe opacity on CXR concerning for PNA , unknown pathogen at this time  - Received azithromycin and Rocephin in ED empirically, but she lives in SNF per report, so broadened coverage for HCAP - Empiric cefepime and vancomycin; de-escalate according to forthcoming microbiology data  - keep on oxygen via [place] if needed to maintain saturations > 90% - PT evaluation to assess gait and balance due to high risk fall   Active Problems:   Depression with anxiety - appears to be stable at this time - [facility] pleasant     CKD stage III  - SCr 1.35, up from apparent baseline of 1.1  - Suspect the bump is d/t the current illness and associated dehydration, pre renal etiology  - repeat BMP In AM    Atrial fibrillation, CHADS2 score 4 - Noted on admission EKG, appears to be new-onset, possibly secondary to acute pulmonary disease  - Rate is well-controlled  - CHADS-VASc is 4 for age x2, gender, HTN; has history of  falls with hip fx  - no AC due to high risk fall    Hypokalemia - mild, supplement and repeat BMP in AM    Hypertension, essential  - reasonable given her age     Underweight, BMI < 18 - Body mass index is 17.69 kg/(m^2). - nutritionist consulted   DVT prophylaxis - Heparin SQ  Code Status: DNR Family Communication:  plan of care discussed with the patient Disposition Plan: SNF by 4/1  IV access:  Peripheral IV  Procedures and diagnostic studies:    Dg Chest 2 View 05/26/2015 Left lung base atelectasis/scarring. Developing pneumonia is excluded. Dilatation of the aortic root measuring up to 7 cm in diameter.  Ct Head Wo Contrast 05/26/2015 No acute finding or change since 2016. 2. Atrophy and chronic small vessel disease as described.   Medical Consultants:  PT  Other Consultants:  None  IAnti-Infectives:   Vancomycin 3/28 --> Maxipime 3/28 -->  Debbora PrestoMAGICK-Laurabeth Yip, MD  [facility] Pager [phone]  If 7PM-7AM, please contact night-coverage [email or web address] Password [email or web address] 05/27/2015, 2:19 PM   LOS: 1 day   HPI/Subjective: No events overnight.   Objective: Filed Vitals:   05/26/15 2114 05/26/15 2116 05/27/15 0454 05/27/15 0509  BP:  151/92 156/94 153/54  Pulse:  78 61 59  Temp:  98.5 F (36.9 C) 97.5 F (36.4 C)   TempSrc:  Oral Oral   Resp:  18 18   Height: 5\' 7"  (1.702 m)     Weight: 51.256 kg (113 lb)  SpO2:  100% 95%    No intake or output data in the 24 hours ending 05/27/15 1419  Exam:   General:  Pt is alert, follows commands appropriately, not in acute distress, HOH  Cardiovascular: Irregular rate and rhythm, no rubs, no gallops  Respiratory: rhonchi at bases with no wheezing   Abdomen: Soft, non tender, non distended, bowel sounds present, no guarding  Extremities: No edema, pulses DP and PT palpable bilaterally  Data Reviewed: Basic Metabolic Panel:  Recent Labs Lab 05/26/15 1840 05/27/15 0315  NA 143 138  K 3.6 3.4*  CL 107 103   CO2 25 26  GLUCOSE 143* 119*  BUN 23* 18  CREATININE 1.35* 1.02*  CALCIUM 9.8 9.3   Liver Function Tests:  Recent Labs Lab 05/26/15 1840  AST 26  ALT 12*  ALKPHOS 99  BILITOT 0.8  PROT 6.2*  ALBUMIN 3.4*   CBC:  Recent Labs Lab 05/26/15 1840 05/27/15 0315  WBC 10.1 10.9*  NEUTROABS 9.0* 7.7  HGB 13.9 13.9  HCT 43.1 41.0  MCV 92.1 89.9  PLT 275 283   Cardiac Enzymes:  Recent Labs Lab 05/26/15 1840  TROPONINI 0.03   Scheduled Meds: . ceFEPime (MAXIPIME) IV  1 g Intravenous Q24H  . celecoxib  100 mg Oral BID  . darifenacin  7.5 mg Oral Daily  . enoxaparin (LOVENOX) injection  30 mg Subcutaneous Q24H  . escitalopram  10 mg Oral Daily  . mirtazapine  15 mg Oral QHS  . multivitamin with minerals  1 tablet Oral Daily  . [START ON 05/28/2015] pneumococcal 23 valent vaccine  0.5 mL Intramuscular Tomorrow-1000  . vancomycin  750 mg Intravenous Q48H   Continuous Infusions:

## 2015-05-27 NOTE — Progress Notes (Signed)
Pt heart rate sustaining in the 40's, patient asleep. Paged oncall NP. Awaiting callback or orders placed. Will continue to monitor and make day RN aware.

## 2015-05-27 NOTE — Care Management Note (Addendum)
Case Management Note  Patient Details  Name: Veronica [name] MRN: [id number] Date of Birth: [birth year]  Subjective/Objective: [age] y.o. F admitted 05/26/2015 for HCAP. Increased lethargy x 1 week. Conflicting reports in which writer will investigate today re SNF vs home with family prior to admission. Will alert  CSW either way and ensure MD orders PT eval.    Action/Plan: Will continue to follow for discharge needs.    Expected Discharge Date:                  Expected Discharge Plan:  Skilled Nursing Facility (from SNF? )  In-House Referral:  Clinical Social Work  Discharge planning Services  CM Consult  Post Acute Care Choice:  Durable Medical Equipment Choice offered to:     DME Arranged:    DME Agency:     HH Arranged:    HH Agency:     Status of Service:  In process, will continue to follow  Medicare Important Message Given:    Date Medicare IM Given:    Medicare IM give by:    Date Additional Medicare IM Given:    Additional Medicare Important Message give by:     If discussed at Long Length of Stay Meetings, dates discussed:    Additional Comments:  Veronica [name], Veronica Linebaugh M, RN 05/27/2015, 9:06 AM

## 2015-05-27 NOTE — Progress Notes (Signed)
Received patient to room 22 via stretcher from ED. No family members present. Patient is alert to self and time. HOH, patient's buttocks are red and blanchable. Placed on high fall risk.  Dorothe PeaKaren Jennilee Demarco, RN

## 2015-05-27 NOTE — Progress Notes (Signed)
Initial Nutrition Assessment  DOCUMENTATION CODES:   Underweight  INTERVENTION:   -Ensure Enlive po BID, each supplement provides 350 kcal and 20 grams of protein  NUTRITION DIAGNOSIS:   Predicted suboptimal nutrient intake related to lethargy/confusion as evidenced by per patient/family report.  GOAL:   Patient will meet greater than or equal to 90% of their needs  MONITOR:   PO intake, Supplement acceptance, Labs, Weight trends, Skin, I & O's  REASON FOR ASSESSMENT:   Consult, Low Braden  (underweight)  ASSESSMENT:   Veronica [name] is a [age] y.o. female with PMH of depression, anxiety, and chronic kidney disease stage III who is brought into the ED for 1 week of lethargy and weakness that is acutely worsened the day of admission. At baseline, the patient reportedly ambulates with a walker and engages in conversation with visitors. For the past week, she has exhibited progressive lethargy and weakness, culminating in her inability to sit up by herself and nonverbal state today. Patient has not voiced any specific complaints and has not been noted to have cough, fever, vomiting, or diarrhea. There has been no apparent fall or trauma.   Pt admitted with HCAP.   Pt unable to provide hx. Pt is currently agitated at time of visit; calling out for RN and her son.   Reviewed wt hx; noted wt stability over the past year.   Nutrition-Focused physical exam completed. Findings are mild to moderate fat depletion, mild to moderate muscle depletion, and no edema. It is difficult to determine whether depletion is related to advanced age, limited mobility, or suboptimal nutritional status. RD suspects etiology is multifactorial.   Pt would benefit from supplement to optimize nutritional status.   Medications reviewed. Pt with MVI and remeron.   Labs reviewed: K: 3.4.   Diet Order:  DIET SOFT Room service appropriate?: Yes; Fluid consistency:: Thin  Skin:  Reviewed, no issues  Last  BM:  05/26/15  Height:   Ht Readings from Last 1 Encounters:  05/26/15 5\' 7"  (1.702 m)    Weight:   Wt Readings from Last 1 Encounters:  05/26/15 113 lb (51.256 kg)    Ideal Body Weight:  61.4 kg  BMI:  Body mass index is 17.69 kg/(m^2).  Estimated Nutritional Needs:   Kcal:  1200-1400  Protein:  50-65 grams  Fluid:  1.2-1.4 L  EDUCATION NEEDS:   No education needs identified at this time  Veronica [name], RD, LDN, CDE Pager: [phone] After hours Pager: [phone]

## 2015-05-27 NOTE — Progress Notes (Signed)
Left message for patient's son to call CSW.  Osborne Cascoadia Tinnie Kunin LCSWA [phone]

## 2015-05-28 LAB — BASIC METABOLIC PANEL
Anion gap: 10 (ref 5–15)
BUN: 19 mg/dL (ref 6–20)
CALCIUM: 9.5 mg/dL (ref 8.9–10.3)
CO2: 24 mmol/L (ref 22–32)
CREATININE: 0.91 mg/dL (ref 0.44–1.00)
Chloride: 108 mmol/L (ref 101–111)
GFR calc Af Amer: 58 mL/min — ABNORMAL LOW (ref >60)
GFR, EST NON AFRICAN AMERICAN: 50 mL/min — AB (ref >60)
Glucose, Bld: 109 mg/dL — ABNORMAL HIGH (ref 65–99)
Potassium: 3.8 mmol/L (ref 3.5–5.1)
SODIUM: 142 mmol/L (ref 135–145)

## 2015-05-28 LAB — CBC
HCT: 41.6 % (ref 36.0–46.0)
Hemoglobin: 13.8 g/dL (ref 12.0–15.0)
MCH: 29.8 pg (ref 26.0–34.0)
MCHC: 33.2 g/dL (ref 30.0–36.0)
MCV: 89.8 fL (ref 78.0–100.0)
PLATELETS: 265 10*3/uL (ref 150–400)
RBC: 4.63 MIL/uL (ref 3.87–5.11)
RDW: 13.3 % (ref 11.5–15.5)
WBC: 7.7 10*3/uL (ref 4.0–10.5)

## 2015-05-28 LAB — LEGIONELLA PNEUMOPHILA SEROGP 1 UR AG: L. PNEUMOPHILA SEROGP 1 UR AG: NEGATIVE

## 2015-05-28 LAB — PROCALCITONIN: Procalcitonin: <0.1 ng/mL

## 2015-05-28 NOTE — Evaluation (Signed)
Physical Therapy Evaluation Patient Details Name: CAITLAND PORCHIA MRN: [id number] DOB: 13-Jul-1912 Today's Date: 05/28/2015   History of Present Illness  Patient is a 82 y/o female with hx of anxiety, shingles, CKD presents with lethargy and weakness and found to have HCAP and new onset A-fib.   Clinical Impression  Patient presents with generalized weakness, decreased cognition, difficulty sustaining attention to task, easily distracted and decreased mobility. Pt very HOH. Difficult to perform assessment due to cognitive deficits vs HOH and do not know pt's baseline as no family present. Per chart, pt ambulatory at home with RW. Pt requires Mod A for transfer to chair at this time. Would benefit from ST SNF to maximize independence and mobilty as pt not functioning at baseline. Will follow acutely.     Follow Up Recommendations SNF    Equipment Recommendations  None recommended by PT    Recommendations for Other Services OT consult     Precautions / Restrictions Precautions Precautions: Fall Restrictions Weight Bearing Restrictions: No      Mobility  Bed Mobility Overal bed mobility: Needs Assistance Bed Mobility: Supine to Sit     Supine to sit: Max assist;HOB elevated     General bed mobility comments: Assist to bring LEs and bottom to EOB and to elevate trunk. Pt very distracted and not able to assist.   Transfers Overall transfer level: Needs assistance Equipment used: None Transfers: Sit to/from [employer] Transfers   Stand pivot transfers: Mod assist       General transfer comment: Attempted to stand using Rw however pt did not seem familiar with RW pushing it away. Performed SPT to chair with Mod A for support.  Ambulation/Gait                Stairs            Wheelchair Mobility    Modified Rankin (Stroke Patients Only)       Balance Overall balance assessment: Needs assistance Sitting-balance support: Feet supported Sitting  balance-Leahy Scale: Poor Sitting balance - Comments: Pt continually losing balance posteriorly sitting EOB without support with hips/knees in flexion. Postural control: Posterior lean Standing balance support: During functional activity Standing balance-Leahy Scale: Poor Standing balance comment: Requires assist to maintain standing.                             Pertinent Vitals/Pain Pain Assessment: Faces Faces Pain Scale: No hurt    Home Living Family/patient expects to be discharged to:: Skilled nursing facility Living Arrangements: Children Available Help at Discharge: Available PRN/intermittently;Family Type of Home: House Home Access: Level entry     Home Layout: Able to live on main level with bedroom/bathroom Home Equipment: Dan Humphreys - 2 wheels Additional Comments: Not sure what else pt owns.    Prior Function Level of Independence: Independent with assistive device(s)         Comments: Per MD notes, pt using RW for ambulation PTA however not able to communicate with pt as she is very HOH and possibly some cognitive deficits?     Hand Dominance        Extremity/Trunk Assessment   Upper Extremity Assessment: Defer to OT evaluation           Lower Extremity Assessment: Generalized weakness (Pt with increased hip/knee flexion but difficult to do assessment aspt wtih cognitive deficits and not able to follow commands vs HOH)  Communication   Communication: HOH  Cognition Arousal/Alertness: Lethargic Behavior During Therapy: Restless (Pt picking at pad, IV tubing, gown.) Overall Cognitive Status: No family/caregiver present to determine baseline cognitive functioning Area of Impairment: Attention;Awareness;Problem solving             Problem Solving: Difficulty sequencing;Requires verbal cues General Comments: "what time is it?" Pt easily distracted and picking at bed sheets, IV pole, gown. Difficult to keep attention despite max  multimodal cues.    General Comments General comments (skin integrity, edema, etc.): Son not present during session so not sure of pt's cognitive baseline.    Exercises        Assessment/Plan    PT Assessment Patient needs continued PT services  PT Diagnosis Altered mental status;Difficulty walking;Generalized weakness   PT Problem List Decreased strength;Decreased cognition;Decreased activity tolerance;Decreased balance;Decreased mobility;Decreased safety awareness  PT Treatment Interventions Therapeutic exercise;Therapeutic activities;Balance training;Gait training;DME instruction;Cognitive remediation;Functional mobility training;Patient/family education   PT Goals (Current goals can be found in the Care Plan section) Acute Rehab PT Goals Patient Stated Goal: none stated PT Goal Formulation: With patient Time For Goal Achievement: 06/11/15 Potential to Achieve Goals: Fair    Frequency Min 2X/week   Barriers to discharge Decreased caregiver support not sure level of support son can provide    Co-evaluation               End of Session Equipment Utilized During Treatment: Gait belt Activity Tolerance: Patient tolerated treatment well Patient left: in chair;with call bell/phone within reach;with chair alarm set Nurse Communication: Mobility status         Time: [id number] PT Time Calculation (min) (ACUTE ONLY): 21 min   Charges:   PT Evaluation $PT Eval Moderate Complexity: 1 Procedure     PT G Codes:        Jaspal Pultz A Zed Wanninger 05/28/2015, 1:29 PM Mylo RedShauna Saleemah Mollenhauer, PT, DPT [phone]

## 2015-05-28 NOTE — Clinical Social Work Placement (Signed)
   CLINICAL SOCIAL WORK PLACEMENT  NOTE  Date:  05/28/2015  Patient Details  Name: Veronica [name] MRN: [id number] Date of Birth: August 09, 1912  Clinical Social Work is seeking post-discharge placement for this patient at the Skilled  Nursing Facility level of care (*CSW will initial, date and re-position this form in  chart as items are completed):  Yes   Patient/family provided with [facility] Clinical Social Work Department's list of facilities offering this level of care within the geographic area requested by the patient (or if unable, by the patient's family).  Yes   Patient/family informed of their freedom to choose among providers that offer the needed level of care, that participate in Medicare, Medicaid or managed care program needed by the patient, have an available bed and are willing to accept the patient.  Yes   Patient/family informed of [facility]'s ownership interest in [facility] and [facility], as well as of the fact that they are under no obligation to receive care at these facilities.  PASRR submitted to EDS on 05/27/15     PASRR number received on 05/27/15     Existing PASRR number confirmed on       FL2 transmitted to all facilities in geographic area requested by pt/family on 05/27/15     FL2 transmitted to all facilities within larger geographic area on       Patient informed that his/her managed care company has contracts with or will negotiate with certain facilities, including the following:        Yes   Patient/family informed of bed offers received.  [facility]     Physician recommends and patient chooses bed at      [facility]  .  Patient to be transferred to facility by       Patient family notified on   of transfer.  Name of family member notified:        PHYSICIAN       Additional Comment:    _______________________________________________ Mearl LatinNadia S Francesa Eugenio,  LCSWA 05/28/2015, 1:56 PM

## 2015-05-28 NOTE — Clinical Social Work Note (Signed)
Clinical Social Work Assessment  Patient Details  Name: Veronica [name] MRN: [id number] Date of Birth: 12-01-1912  Date of referral:  05/28/15               Reason for consult:  Facility Placement                Permission sought to share information with:  Facility Medical sales representativeContact Representative, Family Supports Permission granted to share information::  No (Patient disoriented; completed assessment with patient's son)  Name::     Casimiro NeedleMichael  [facility]  Relationship::  Son  Contact Information:  [phone]  Housing/Transportation Living arrangements for the past 2 months:  Single Family Home Source of Information:  Adult Children Patient Interpreter Needed:  None Criminal Activity/Legal Involvement Pertinent to Current Situation/Hospitalization:  No - Comment as needed Significant Relationships:  Adult Children Lives with:  Adult Children Do you feel safe going back to the place where you live?  No Need for family participation in patient care:  Yes (Comment)  Care giving concerns:  CSW received referral for possible SNF placement at time of discharge. Patient is disoriented but pleasant. CSW spoke with patient's son regarding PT recommendation of SNF placement at time of discharge. Per patient's son, patient's son is currently unable to care for patient at their home given patient's current physical needs and fall risk. Patient's son expressed understanding of PT recommendation and is agreeable to SNF placement at time of discharge. CSW to continue to follow and assist with discharge planning needs.   Social Worker assessment / plan:  CSW spoke with patient's son concerning [facility] before [facility].  Employment status:  Retired Database administratornsurance information:  Managed Medicare PT Recommendations:  Skilled Nursing Facility Information / Referral to community resources:  Skilled Nursing Facility  Patient/Family's Response to care:  Patient's son  recognizes need for patient to go to rehab before returning home and is agreeable [place]. Patient's son reported [facility] since he has volunteered with [facility]. He is unsure of patient's long term care plan at this time.  Patient/Family's Understanding of and Emotional Response to Diagnosis, Current Treatment, and Prognosis:  Patient/son is realistic regarding therapy needs. No questions/concerns about plan or treatment.    Emotional Assessment Appearance:  Appears stated age Attitude/Demeanor/Rapport:   (Pleasant but disoriented) Affect (typically observed):  Unable to Assess Orientation:  Oriented to Self Alcohol / Substance use:  Not Applicable Psych involvement (Current and /or in the community):  No (Comment)  Discharge Needs  Concerns to be addressed:  Care Coordination Readmission within the last 30 days:  No Current discharge risk:  None Barriers to Discharge:  Continued Medical Work up   [employer]ia S Miel Wisener, LCSWA 05/28/2015, 1:52 PM

## 2015-05-28 NOTE — Progress Notes (Signed)
Pharmacy Antibiotic Note  Veronica [name] is a [age] y.o. female admitted on 05/26/2015 with pneumonia.  Pharmacy has been consulted for Vancomycin and Cefepime dosing.  Plan: 82yo on Vancomycin and Cefepime for pneumonia.  Pt is afebrile and WBC wnl today.  He is currently on Vancomycin q48 due to renal fxn, but Cr has improved and will adjust dosing.  Plan is to convert to PO antibiotics on 3/31.  1-  Change Vancomycin to 500mg  IV q24 2-  Continue Cefepime 1g IV q24 3-  Watch clinical status 4-  F/U plans to convert to PO abx  Height: 5\' 7"  (170.2 cm) Weight: 113 lb (51.256 kg) IBW/kg (Calculated) : 61.6  Temp (24hrs), Avg:98.3 F (36.8 C), Min:97.6 F (36.4 C), Max:99.1 F (37.3 C)   Recent Labs Lab 05/26/15 1840 05/27/15 0022 05/27/15 0315 05/28/15 0453  WBC 10.1  --  10.9* 7.7  CREATININE 1.35*  --  1.02* 0.91  LATICACIDVEN  --  1.8 1.3  --     Estimated Creatinine Clearance: 26.6 mL/min (by C-G formula based on Cr of 0.91).    No Known Allergies  Antimicrobials this admission: Azithro 3/28 x 1 CTX 3/28 x 1 Vanc 3/29 >> Cefepime 3/29 >>  Dose adjustments this admission: 3/30- Vanc adjusted for improving Cr  Microbiology results: 3/28 RCx >> ntd  Thank you for allowing pharmacy to be a part of this patient's care.  Marisue HumbleKendra Wister Hoefle, PharmD Clinical Pharmacist [facility]

## 2015-05-28 NOTE — Progress Notes (Signed)
Patient ID: Veronica [name], female   DOB: May 19, 1912, 82 y.o.   MRN: [id number]  [facility] PROGRESS NOTE  JIALI LINNEY WUJ:[id number] DOB: Jul 12, 1912 DOA: 05/26/2015 PCP: Kirt Boys, DO   Brief narrative:    82 y.o. female with PMH of depression, anxiety, and chronic kidney disease stage III who is brought into the ED for 1 week of lethargy and weakness, poor oral intake, unable to ambulate (at baseline using walker but rather independent).   In ED, patient was found to be afebrile, saturating adequately on room air, and with vital signs stable. EKG demonstrates atrial fibrillation with LVH by voltage criteria, RBBB and left anterior fascicular block. Chest x-ray demonstrates an opacity in the left base concerning for possible pneumonia. Given her apparent residence in a nursing facility, patient was admitted for evaluation and management of suspected HCAP.  Assessment/Plan:    Principal Problem:   HCAP (healthcare-associated pneumonia) - Left lower lobe opacity on CXR concerning for PNA , unknown pathogen at this time  - she lives in SNF per report, so broadened coverage for HCAP - Empiric cefepime and vancomycin; transition to oral ABX in AM - keep on oxygen via [place] if needed to maintain saturations > 90% - son agrees with SNF placement, work in progress   Active Problems:   Depression with anxiety - appears to be stable at this time - pt is [facility] but very pleasant     CKD stage III  - SCr 1.35, up from apparent baseline of 1.1  - Suspect the bump is d/t the current illness and associated dehydration, pre renal etiology  - Cr is WNL    Atrial fibrillation, CHADS2 score 4 - Noted on admission EKG, appears to be new-onset, possibly secondary to acute pulmonary disease  - Rate is well-controlled  - CHADS-VASc is 4 for age x2, gender, HTN; has history of falls with hip fx  - no AC due to high risk fall    Hypokalemia - mild, supplement and repeat BMP in  AM    Hypertension, essential  - add hydralazine as needed     Underweight, BMI < 18 - Body mass index is 17.69 kg/(m^2). - nutritionist consulted   DVT prophylaxis - Heparin SQ  Code Status: DNR Family Communication:  plan of care discussed with the patient Disposition Plan: SNF by 4/1  IV access:  Peripheral IV  Procedures and diagnostic studies:    Dg Chest 2 View 05/26/2015 Left lung base atelectasis/scarring. Developing pneumonia is excluded. Dilatation of the aortic root measuring up to 7 cm in diameter.  Ct Head Wo Contrast 05/26/2015 No acute finding or change since 2016. 2. Atrophy and chronic small vessel disease as described.   Medical Consultants:  PT  Other Consultants:  None  IAnti-Infectives:   Vancomycin 3/28 --> Maxipime 3/28 -->  Debbora Presto, MD  [facility] Pager [phone]  If 7PM-7AM, please contact night-coverage [email or web address] Password [email or web address] 05/28/2015, 2:05 PM   LOS: 2 days   HPI/Subjective: No events overnight.   Objective: Filed Vitals:   05/27/15 0509 05/27/15 1434 05/27/15 2141 05/28/15 0607  BP: 153/54 176/88 141/90 174/74  Pulse: 59 65 76 32  Temp:  98.1 F (36.7 C) 99.1 F (37.3 C) 97.6 F (36.4 C)  TempSrc:   Oral Oral  Resp:  Height:      Weight:      SpO2:  99% 97% 96%    Intake/Output Summary (Last 24 hours) at 05/28/15  1405 Last data filed at 05/28/15 0656  Gross per 24 hour  Intake    220 ml  Output      0 ml  Net    220 ml    Exam:   General:  Pt is alert, follows commands appropriately, not in acute distress, HOH  Cardiovascular: Irregular rate and rhythm, no rubs, no gallops  Respiratory: rhonchi at bases with no wheezing   Abdomen: Soft, non tender, non distended, bowel sounds present, no guarding  Extremities: No edema, pulses DP and PT palpable bilaterally  Data Reviewed: Basic Metabolic Panel:  Recent Labs Lab 05/26/15 1840 05/27/15 0315 05/28/15 0453  NA 143 138 142  K 3.6  3.4* 3.8  CL 107 103 108  CO2 25 26 24   GLUCOSE 143* 119* 109*  BUN 23* 18 19  CREATININE 1.35* 1.02* 0.91  CALCIUM 9.8 9.3 9.5   Liver Function Tests:  Recent Labs Lab 05/26/15 1840  AST 26  ALT 12*  ALKPHOS 99  BILITOT 0.8  PROT 6.2*  ALBUMIN 3.4*   CBC:  Recent Labs Lab 05/26/15 1840 05/27/15 0315 05/28/15 0453  WBC 10.1 10.9* 7.7  NEUTROABS 9.0* 7.7  --   HGB 13.9 13.9 13.8  HCT 43.1 41.0 41.6  MCV 92.1 89.9 89.8  PLT 275 283 265   Cardiac Enzymes:  Recent Labs Lab 05/26/15 1840  TROPONINI 0.03   Scheduled Meds: . ceFEPime (MAXIPIME) IV  1 g Intravenous Q24H  . celecoxib  100 mg Oral BID  . darifenacin  7.5 mg Oral Daily  . enoxaparin (LOVENOX) injection  30 mg Subcutaneous Q24H  . escitalopram  10 mg Oral Daily  . feeding supplement (ENSURE ENLIVE)  237 mL Oral BID BM  . mirtazapine  15 mg Oral QHS  . multivitamin with minerals  1 tablet Oral Daily  . pneumococcal 23 valent vaccine  0.5 mL Intramuscular Tomorrow-1000  . vancomycin  750 mg Intravenous Q48H   Continuous Infusions:

## 2015-05-29 DIAGNOSIS — J189 Pneumonia, unspecified organism: Secondary | ICD-10-CM | POA: Diagnosis not present

## 2015-05-29 LAB — CBC
HCT: 39.7 % (ref 36.0–46.0)
Hemoglobin: 13.4 g/dL (ref 12.0–15.0)
MCH: 30.6 pg (ref 26.0–34.0)
MCHC: 33.8 g/dL (ref 30.0–36.0)
MCV: 90.6 fL (ref 78.0–100.0)
PLATELETS: 302 10*3/uL (ref 150–400)
RBC: 4.38 MIL/uL (ref 3.87–5.11)
RDW: 13.7 % (ref 11.5–15.5)
WBC: 7.6 10*3/uL (ref 4.0–10.5)

## 2015-05-29 LAB — BASIC METABOLIC PANEL
Anion gap: 7 (ref 5–15)
BUN: 24 mg/dL — AB (ref 6–20)
CALCIUM: 9.5 mg/dL (ref 8.9–10.3)
CO2: 26 mmol/L (ref 22–32)
CREATININE: 1.07 mg/dL — AB (ref 0.44–1.00)
Chloride: 108 mmol/L (ref 101–111)
GFR calc Af Amer: 48 mL/min — ABNORMAL LOW (ref >60)
GFR, EST NON AFRICAN AMERICAN: 41 mL/min — AB (ref >60)
GLUCOSE: 90 mg/dL (ref 65–99)
Potassium: 4 mmol/L (ref 3.5–5.1)
SODIUM: 141 mmol/L (ref 135–145)

## 2015-05-29 MED ORDER — CIPROFLOXACIN HCL 500 MG PO TABS: 500.0000 mg | ORAL_TABLET | Freq: Two times a day (BID) | ORAL | Status: DC

## 2015-05-29 MED ORDER — OXYCODONE HCL 5 MG PO TABS: 5.0000 mg | ORAL_TABLET | Freq: Four times a day (QID) | ORAL | Status: AC | PRN

## 2015-05-29 MED ORDER — DOXYCYCLINE HYCLATE 100 MG PO CAPS: 100.0000 mg | ORAL_CAPSULE | Freq: Two times a day (BID) | ORAL | Status: AC

## 2015-05-29 NOTE — Progress Notes (Signed)
Report given to Pattricia BossAnnie ,[facility]    [phone]

## 2015-05-29 NOTE — Progress Notes (Signed)
Pt pulse 33 was running low this morning, Physician on call [place] was informed gave an order to rechecked it whiles pt is  awake and call him back, pulse 57 when rechecked whiles pt was awake, physician was paged backed, call me on phone that he will leave a note to her MD that will review pt this morning, will continue to monitor pt

## 2015-05-29 NOTE — Care Management Important Message (Signed)
Important Message  Patient Details  Name: Veronica [name] MRN: [id number] Date of Birth: 1912-10-08   Medicare Important Message Given:  Yes    Kyla BalzarineShealy, Pinchos Topel Abena 05/29/2015, 11:23 AM

## 2015-05-29 NOTE — Clinical Social Work Placement (Signed)
   CLINICAL SOCIAL WORK PLACEMENT  NOTE  Date:  05/29/2015  Patient Details  Name: Veronica [name] MRN: [id number] Date of Birth: 11-23-1912  Clinical Social Work is seeking post-discharge placement for this patient at the Skilled  Nursing Facility level of care (*CSW will initial, date and re-position this form in  chart as items are completed):  Yes   Patient/family provided with [place] Clinical Social Work Department's list of facilities offering this level of care within the geographic area requested by the patient (or if unable, by the patient's family).  Yes   Patient/family informed of their freedom to choose among providers that offer the needed level of care, that participate in Medicare, Medicaid or managed care program needed by the patient, have an available bed and are willing to accept the patient.  Yes   Patient/family informed of [place]'s ownership interest in [facility] and [facility], as well as of the fact that they are under no obligation to receive care at these facilities.  PASRR submitted to EDS on 05/27/15     PASRR number received on 05/27/15     Existing PASRR number confirmed on       FL2 transmitted to all facilities in geographic area requested by pt/family on 05/27/15     FL2 transmitted to all facilities within larger geographic area on       Patient informed that his/her managed care company has contracts with or will negotiate with certain facilities, including the following:        Yes   Patient/family informed of bed offers received.  [facility]     Physician recommends and patient chooses bed at      [facility].  Patient to be transferred to facility by Car     Patient family notified on 05/29/15 of transfer.  Name of family member notified:  Veronica NeedleMichael, son     PHYSICIAN       Additional Comment:     _______________________________________________ Mearl LatinNadia S Laquentin Loudermilk, LCSWA 05/29/2015, 12:00 PM

## 2015-05-29 NOTE — Progress Notes (Signed)
Phone call left with son about patient's discharge paperwork being complete and that is okay to come pick patient up.

## 2015-05-29 NOTE — Care Management Note (Signed)
Case Management Note  Patient Details  Name: Lurena NidaVerniece B Yanko MRN: [id number] Date of Birth: [birth year]  Subjective/Objective:                    Action/Plan:  DC to SNF today as facilitated through CSW Expected Discharge Date:                  Expected Discharge Plan:  Skilled Nursing Facility (from SNF? )  In-House Referral:  Clinical Social Work  Discharge planning Services  CM Consult  Post Acute Care Choice:  Durable Medical Equipment Choice offered to:     DME Arranged:    DME Agency:     HH Arranged:    HH Agency:     Status of Service:  Completed, signed off  Medicare Important Message Given:  Yes Date Medicare IM Given:    Medicare IM give by:    Date Additional Medicare IM Given:    Additional Medicare Important Message give by:     If discussed at Long Length of Stay Meetings, dates discussed:    Additional Comments:  Lawerance SabalDebbie Jaivyn Gulla, RN 05/29/2015, 11:47 AM

## 2015-05-29 NOTE — Discharge Instructions (Signed)

## 2015-05-29 NOTE — Discharge Summary (Addendum)
Physician Discharge Summary  Veronica [name]:[id number] DOB: [birth year] DOA: 05/26/2015  PCP: Kirt Boys, DO  Admit date: 05/26/2015 Discharge date: 05/29/2015  Recommendations for Outpatient Follow-up:  1. Pt will need to follow up with PCP in 1-2 weeks post discharge 2. Please obtain BMP to evaluate electrolytes and kidney function 3. Please also check CBC to evaluate Hg and Hct levels 4. Complete treatment with doxycyline for 5 more days  Discharge Diagnoses:  Principal Problem:   HCAP (healthcare-associated pneumonia) Active Problems:   Essential hypertension   CKD (chronic kidney disease), stage III   Atrial fibrillation (HCC)  Discharge Condition: Stable  Diet recommendation: soft    Brief narrative:    82 y.o. female with PMH of depression, anxiety, and chronic kidney disease stage III who is brought into the ED for 1 week of lethargy and weakness, poor oral intake, unable to ambulate (at baseline using walker but rather independent).   In ED, patient was found to be afebrile, saturating adequately on room air, and with vital signs stable. EKG demonstrates atrial fibrillation with LVH by voltage criteria, RBBB and left anterior fascicular block. Chest x-ray demonstrates an opacity in the left base concerning for possible pneumonia. Given her apparent residence in a nursing facility, patient was admitted for evaluation and management of suspected HCAP.  Assessment/Plan:    Principal Problem:  HCAP (healthcare-associated pneumonia) - Left lower lobe opacity on CXR concerning for PNA , unknown pathogen at this time  - she lives in SNF per report, so broadened coverage for HCAP - Empiric cefepime and vancomycin started on admission; transition to oral ABX today  - son agrees with SNF placement, work in progress   Active Problems:  Depression with anxiety - appears to be stable at this time - pt is [facility] but very pleasant    CKD stage III  - SCr  1.35, up from apparent baseline of 1.1  - Suspect the bump is d/t the current illness and associated dehydration, pre renal etiology    Atrial fibrillation, CHADS2 score 4 - Noted on admission EKG, appears to be new-onset, possibly secondary to acute pulmonary disease  - Rate controlled  - CHADS-VASc is 4 for age x2, gender, HTN; has history of falls with hip fx  - no AC due to high risk fall   Hypokalemia - mild, supplement and repeat BMP in AM   Hypertension, essential  - reasonable for her age    Underweight, BMI < 18 - Body mass index is 17.69 kg/(m^2).   Code Status: DNR Family Communication: plan of care discussed with the patient, son over the phone Disposition Plan: SNF   IV access:  Peripheral IV  Procedures and diagnostic studies:   Dg Chest 2 View 05/26/2015 Left lung base atelectasis/scarring. Developing pneumonia is excluded. Dilatation of the aortic root measuring up to 7 cm in diameter.  Ct Head Wo Contrast 05/26/2015 No acute finding or change since 2016. 2. Atrophy and chronic small vessel disease as described.   Medical Consultants:  PT  Other Consultants:  None  IAnti-Infectives:   Vancomycin 3/28 --> 3/31 Maxipime 3/28 --> 3/31 Doxycycline 3/31 --> 5 more days         Discharge Exam: Filed Vitals:   05/29/15 0525 05/29/15 0609  BP: 146/52 118/58  Pulse: 33 57  Temp: 98.5 F (36.9 C)   Resp: 18    Filed Vitals:   05/28/15 1405 05/28/15 2108 05/29/15 0525 05/29/15 0609  BP: 153/74 109/48  146/52 118/58  Pulse: 63 114 33 57  Temp: 98 F (36.7 C) 98 F (36.7 C) 98.5 F (36.9 C)   TempSrc: Oral Oral    Resp: 20 16 18    Height:      Weight:      SpO2: 97% 94% 94%     General: Pt is alert, follows commands appropriately, not in acute distress, HOH Cardiovascular: Regular rate and rhythm, no rubs, no gallops Respiratory: Clear to auscultation bilaterally, no wheezing, no crackles, no rhonchi Abdominal: Soft,  non tender, non distended, bowel sounds +, no guarding   Discharge Instructions  Discharge Instructions    Diet - low sodium heart healthy    Complete by:  As directed      Diet - low sodium heart healthy    Complete by:  As directed      Increase activity slowly    Complete by:  As directed      Increase activity slowly    Complete by:  As directed             Medication List    TAKE these medications        acetaminophen 325 MG tablet  Commonly known as:  TYLENOL  Take 2 tablets (650 mg total) by mouth every 6 (six) hours as needed for mild pain (or Fever >/= 101).     celecoxib 100 MG capsule  Commonly known as:  CELEBREX  Take 1 capsule (100 mg total) by mouth 2 (two) times daily.     [employer] SPECTRAVITE ADULT 50+ Tabs  Take 1 tablet by mouth daily.     doxycycline 100 MG capsule  Commonly known as:  VIBRAMYCIN  Take 1 capsule (100 mg total) by mouth 2 (two) times daily.     escitalopram 10 MG tablet  Commonly known as:  LEXAPRO  Take 1 tablet (10 mg total) by mouth daily.     mirtazapine 15 MG tablet  Commonly known as:  REMERON  Take 1 tablet (15 mg total) by mouth at bedtime.     oxyCODONE 5 MG immediate release tablet  Commonly known as:  Oxy IR/ROXICODONE  Take 1 tablet (5 mg total) by mouth every 6 (six) hours as needed for severe pain.     solifenacin 5 MG tablet  Commonly known as:  VESICARE  Take 1 tablet (5 mg total) by mouth daily.           Follow-up Information    Follow up with [facility].   Specialty:  Skilled Nursing Facility   Contact information:   [address] [place] [id number] [phone]      Follow up with Kirt Boysarter, Monica, DO.   Specialty:  Internal Medicine   Contact information:   [address] [place] [phone]        The results of significant diagnostics from this hospitalization (including imaging, microbiology, ancillary and laboratory) are listed below for  reference.     Microbiology: No results found for this or any previous visit (from the past 240 hour(s)).   Labs: Basic Metabolic Panel:  Recent Labs Lab 05/26/15 1840 05/27/15 0315 05/28/15 0453 05/29/15 0622  NA 143 138 142 141  K 3.6 3.4* 3.8 4.0  CL 107 103 108 108  CO2 25 26 24 26   GLUCOSE 143* 119* 109* 90  BUN 23* 18 19 24*  CREATININE 1.35* 1.02* 0.91 1.07*  CALCIUM 9.8 9.3 9.5 9.5   Liver  Function Tests:  Recent Labs Lab 05/26/15 1840  AST 26  ALT 12*  ALKPHOS 99  BILITOT 0.8  PROT 6.2*  ALBUMIN 3.4*   CBC:  Recent Labs Lab 05/26/15 1840 05/27/15 0315 05/28/15 0453 05/29/15 0622  WBC 10.1 10.9* 7.7 7.6  NEUTROABS 9.0* 7.7  --   --   HGB 13.9 13.9 13.8 13.4  HCT 43.1 41.0 41.6 39.7  MCV 92.1 89.9 89.8 90.6  PLT 275 283 265 302   Cardiac Enzymes:  Recent Labs Lab 05/26/15 1840  TROPONINI 0.03     SIGNED: Time coordinating discharge:  30 minutes  Debbora Presto, MD  [facility] 05/29/2015, 11:27 AM Pager [phone]  If 7PM-7AM, please contact night-coverage [email or web address] Password [email or web address]

## 2015-05-29 NOTE — Progress Notes (Signed)
Physical Therapy Treatment Patient Details Name: Veronica [name] MRN: [id number] DOB: [birth year] Today's Date: 05/29/2015    History of Present Illness Patient is a [age] y/o female with hx of anxiety, shingles, CKD presents with lethargy and weakness and found to have HCAP and new onset A-fib.     PT Comments    Pt found inconitenent of urine and require peri-anal care and placement of brief for PT tx.  Pt receptive to OOB activity but remains easily distracted.    Follow Up Recommendations  SNF     Equipment Recommendations  None recommended by PT    Recommendations for Other Services       Precautions / Restrictions Precautions Precautions: Fall Restrictions Weight Bearing Restrictions: No    Mobility  Bed Mobility Overal bed mobility: Needs Assistance Bed Mobility: Supine to Sit     Supine to sit: Mod assist;+2 for physical assistance;Min assist     General bed mobility comments: Pt able to intermittently assist LEs to edge of bed.  Pt required assist with trunk elevation and scooting to edge of seated surface.    Transfers Overall transfer level: Needs assistance Equipment used: None Transfers: Sit to/from Stand Sit to Stand: Min assist;+2 physical assistance Stand pivot transfers: Mod assist       General transfer comment: Pt demonstrated improved ability to use RW with +2 assist to maintain hand placement.  Required tactile cueing/ assist to placed hands for correct technique.    Ambulation/Gait Ambulation/Gait assistance: Min assist;Mod assist Ambulation Distance (Feet): 150 Feet Assistive device: Rolling walker (2 wheeled) Gait Pattern/deviations: Step-through pattern;Narrow base of support;Drifts right/left;Decreased stride length;Trunk flexed;Shuffle   Gait velocity interpretation: Below normal speed for age/gender General Gait Details: Lateral lean to L, required assist to maintain RW position and balance.     Stairs            Wheelchair  Mobility    Modified Rankin (Stroke Patients Only)       Balance     Sitting balance-Leahy Scale: Fair       Standing balance-Leahy Scale: Poor                      Cognition Arousal/Alertness: Lethargic Behavior During Therapy: Restless (Pt extremely modest picking at gown and pulling down gown.  ) Overall Cognitive Status: No family/caregiver present to determine baseline cognitive functioning Area of Impairment: Attention;Awareness;Problem solving             Problem Solving: Difficulty sequencing;Requires verbal cues General Comments: Pt remains difficult to keep attention, reaching for window, looking around grabbing isolation gowns in halls.      Exercises      General Comments        Pertinent Vitals/Pain Pain Assessment: No/denies pain    Home Living                      Prior Function            PT Goals (current goals can now be found in the care plan section) Acute Rehab PT Goals Patient Stated Goal: none stated Potential to Achieve Goals: Fair Progress towards PT goals: Progressing toward goals    Frequency  Min 2X/week    PT Plan      Co-evaluation             End of Session Equipment Utilized During Treatment: Gait belt Activity Tolerance: Patient tolerated treatment well Patient left: in chair;with call  bell/phone within reach;with chair alarm set     Time: [phone] PT Time Calculation (min) (ACUTE ONLY): 27 min  Charges:  $Gait Training: 8-22 mins $Therapeutic Activity: 8-22 mins                    G Codes:      Florestine Avers 14-Jun-2015, 10:20 AM  Joycelyn Rua, PTA pager [phone]

## 2015-05-29 NOTE — Progress Notes (Signed)
Patient will DC to: [facility] Anticipated DC date: 05/29/15 Family notified: Son Transport by: Son will transport by car. [facility] can accept patient after 2pm.  CSW signing off.  Cristobal GoldmannNadia Mattalyn Anderegg, [place] Clinical Social Worker [phone]

## 2015-05-29 NOTE — Progress Notes (Signed)
Veronica [name] to be D/C'd Skilled nursing facility per MD order.  Discussed with the patient and all questions fully answered.  VSS, Skin clean, dry and intact without evidence of skin break down, no evidence of skin tears noted. IV catheter discontinued intact. Site without signs and symptoms of complications. Dressing and pressure applied.  An After Visit Summary was printed and given to the patient. Patient received prescription.  D/c education completed with patient/family including follow up instructions, medication list, d/c activities limitations if indicated, with other d/c instructions as indicated by MD - patient able to verbalize understanding, all questions fully answered.   Patient instructed to return to ED, call 911, or call MD for any changes in condition.   Patient escorted via WC, and D/C via private auto with sons to SNF  [place], [name] 05/29/2015 2:03 PM

## 2015-06-02 ENCOUNTER — Telehealth: Payer: Self-pay

## 2015-06-02 NOTE — Telephone Encounter (Signed)
Patients son called to check to see if his mother had gotten a flu shot this year as [facility]. Would you like me to take her off as her PCP.

## 2015-06-02 NOTE — Telephone Encounter (Signed)
ok 

## 2015-07-24 ENCOUNTER — Ambulatory Visit: Payer: Medicare Other | Admitting: Internal Medicine

## 2015-11-25 IMAGING — CT CT HIP*R* W/O CM
2 of 3 series · 15 of 46 positions shown, 17 images · non-contrast
Comparison: Radiographs from 01/10/2015

CLINICAL DATA: Right hip pain after fall.

EXAM:
CT OF THE RIGHT HIP WITHOUT CONTRAST
TECHNIQUE: Multidetector CT imaging of the right hip was performed according to
the standard protocol. Multiplanar CT image reconstructions were
also generated.

[Series 3: pelvis 5.0 i31f 1 · axial · 0.35mm/px · z∈[-368,-118]mm · 12 of 58 slices shown, 14 images]
[im 4/58  soft-tissue]
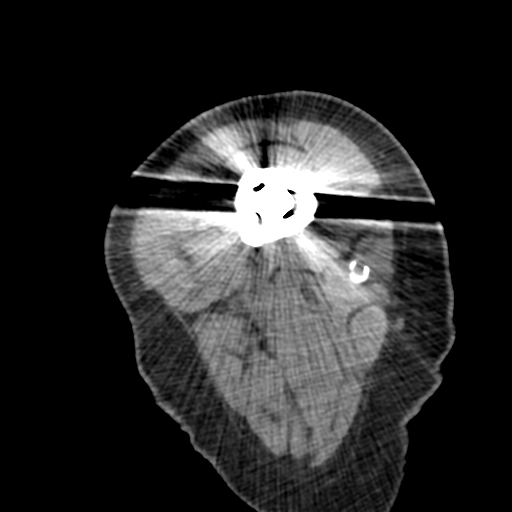
[im 4/58  bone]
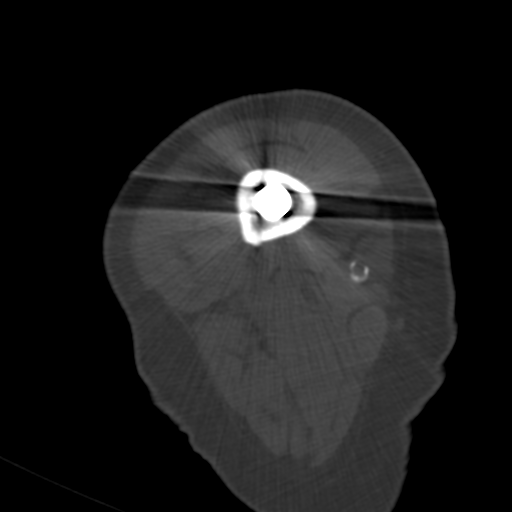
[im 8/58  soft-tissue]
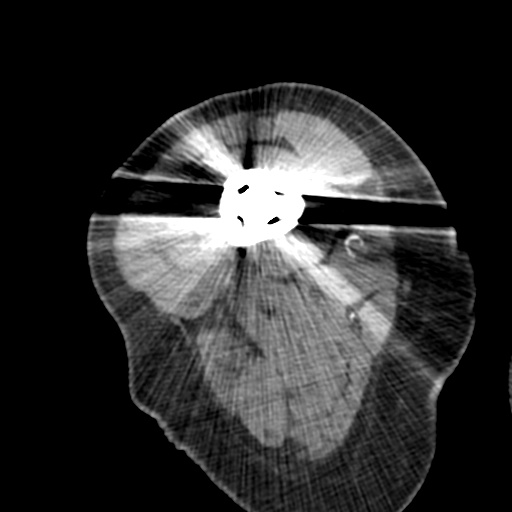
[im 13/58  soft-tissue]
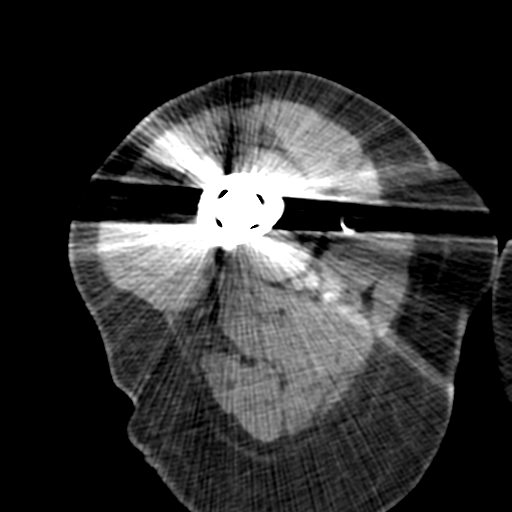
[im 17/58  soft-tissue]
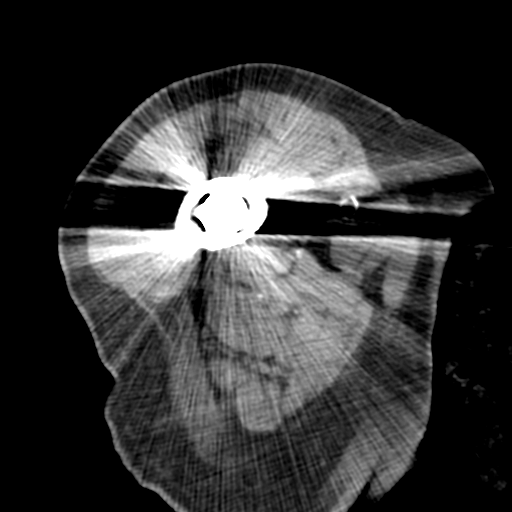
[im 23/58  soft-tissue]
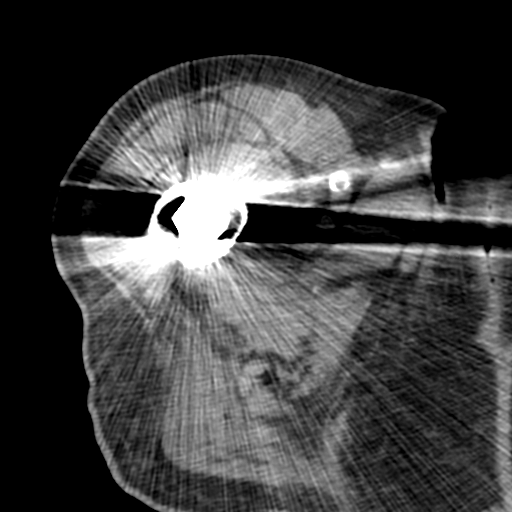
[im 26/58  soft-tissue]
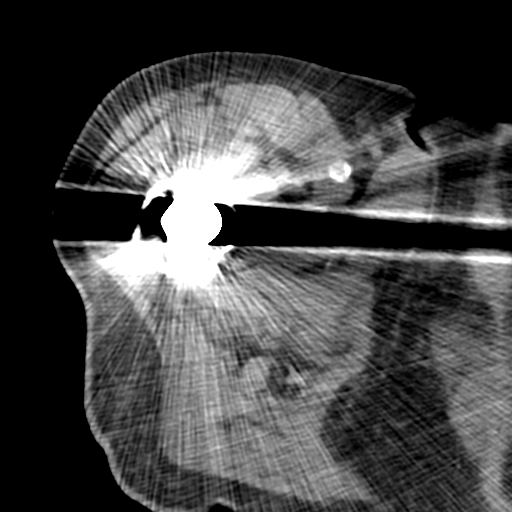
[im 32/58  soft-tissue]
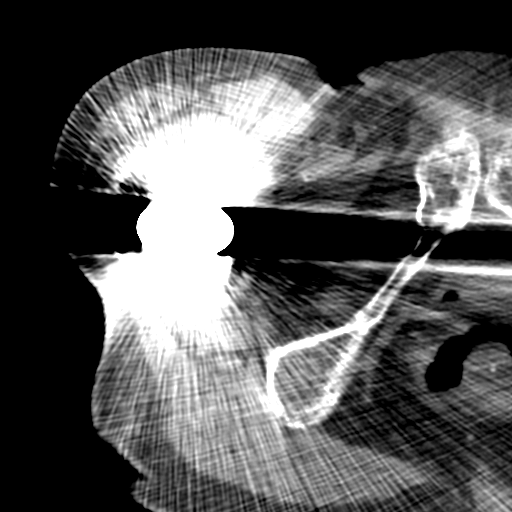
[im 35/58  soft-tissue]
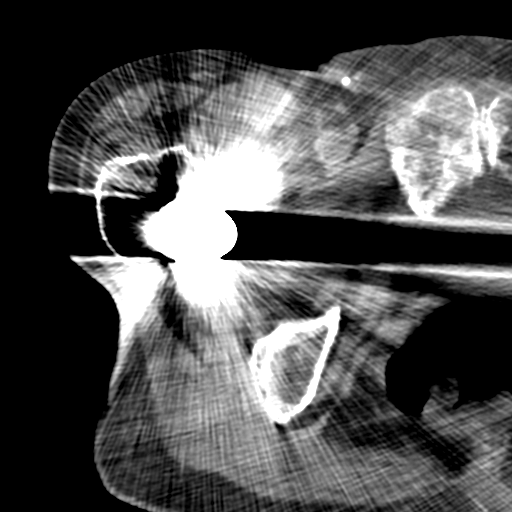
[im 41/58  soft-tissue]
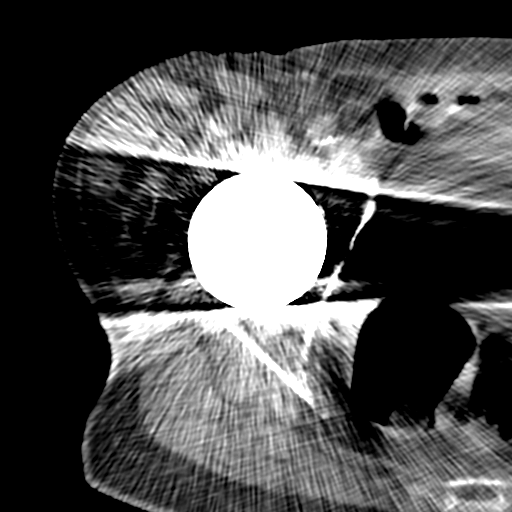
[im 41/58  bone]
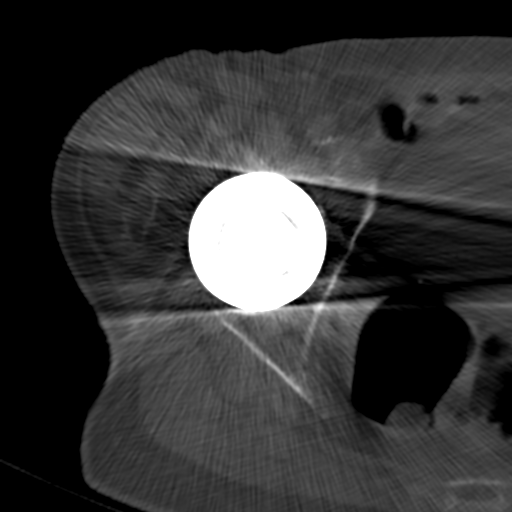
[im 45/58  soft-tissue]
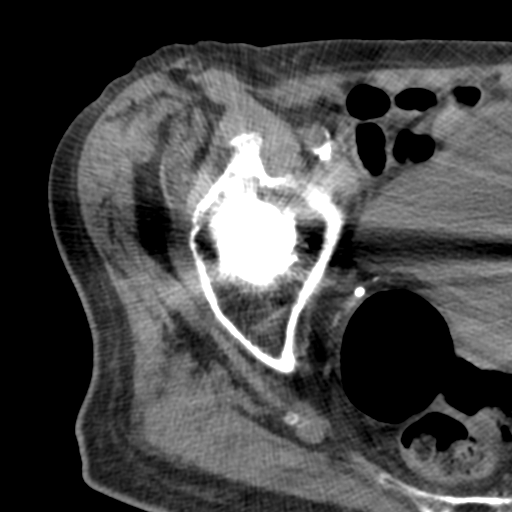
[im 50/58  soft-tissue]
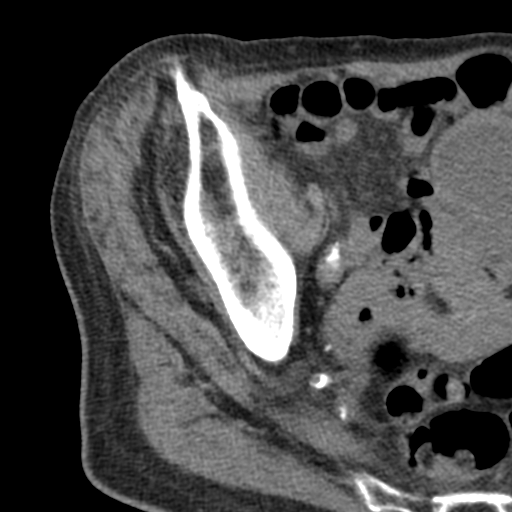
[im 54/58  soft-tissue]
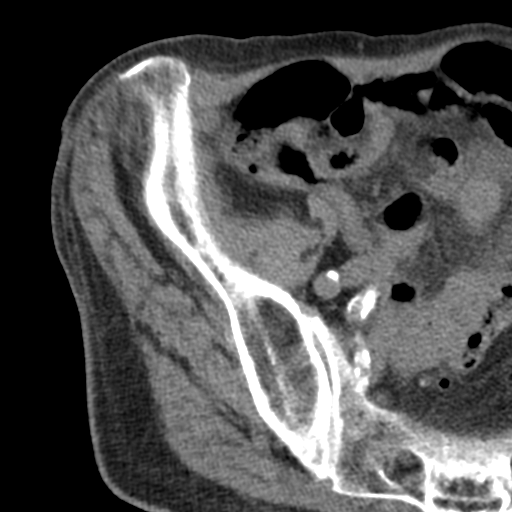

[Series 7: coronal · coronal · 0.39mm/px · 3 of 55 slices shown]
[im 19/55  soft-tissue]
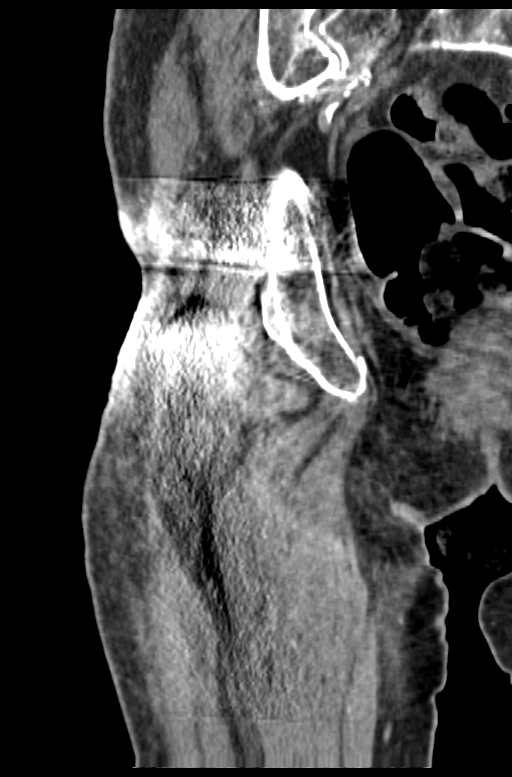
[im 25/55  soft-tissue]
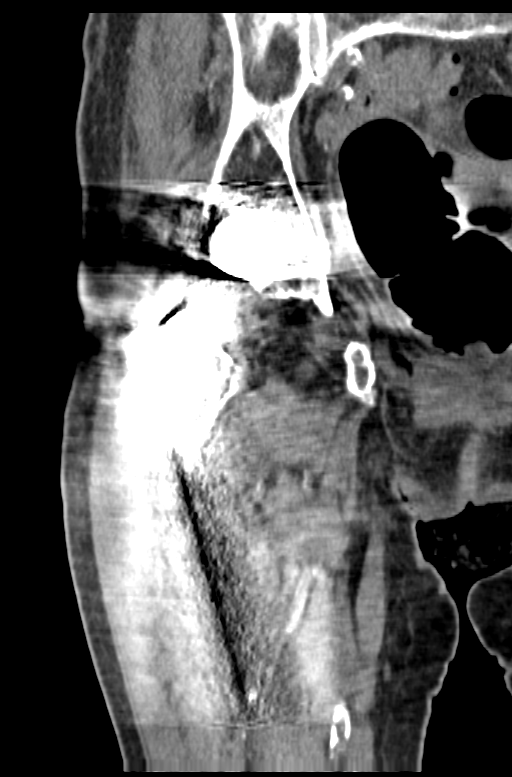
[im 31/55  soft-tissue]
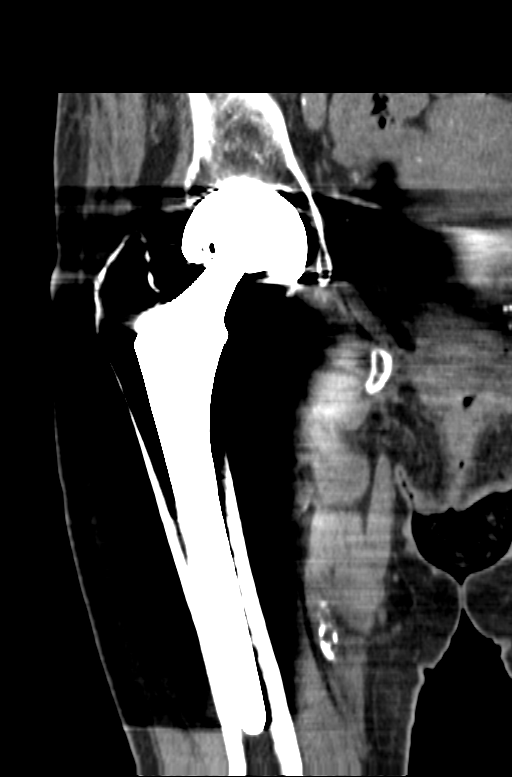

[15 of 46 positions shown; findings below may reference images not displayed]

FINDINGS: A right hip prosthesis is in place. Streak artifact from the
prosthesis mildly obscures surrounding bony structures.

There new bony deformities in the right superior and inferior pubic
ramus, but these for the most part seem to represent mature callus
rather than acute bony discontinuity. They resemble healed fractures
rather than acute fractures. These were not present on 11/10/2009.

There is some increased irregularity along the greater trochanter
cortical margin, for example on image 25 series 6. This is new from
11/10/2009 but also appears to be new bilaterally since that time,
raising the likelihood that this is simply degenerative spurring
rather than a sign of a fracture.

However, on consecutive images 16 through 22 of series 8 there is
slight cortical step-off tracking anteriorly along the right
proximal femoral metaphysis suspicious for a small periprosthetic
fracture. This is nondisplaced and highly subtle. There is a small
chance that this could represent spurring but the location seems
atypical.

Iliac atherosclerosis noted.
IMPRESSION: 1. Suspicion for a small nondisplaced periprosthetic fracture
anteriorly along the right femur subtrochanteric region. Admittedly
this is a subtle finding and could conceivably be due to spurring.
Reference images 16-22 of series [DATE]. Bony deformities in the right pubic rami are new from 5000 but
seen to represent mature callus compatible with old pubic ramus
fractures.
3. Streak artifact from the implant reduces diagnostic
sensitivity/specificity.
4. Iliac atherosclerosis.

## 2016-03-31 DEATH — deceased

## 2016-06-02 NOTE — Progress Notes (Signed)
This encounter was created in error - please disregard.

## 2017-10-18 ENCOUNTER — Encounter: Payer: Self-pay | Admitting: Internal Medicine
